# Patient Record
Sex: Male | Born: 1985 | Race: Black or African American | Hispanic: No | Marital: Married | Smoking: Never smoker
Health system: Southern US, Community
[De-identification: ages and names within clinical notes are randomized; demographics above are authoritative.]

## PROBLEM LIST (undated history)

## (undated) HISTORY — PX: HERNIA REPAIR: SHX51

---

## 2015-03-07 ENCOUNTER — Emergency Department (HOSPITAL_COMMUNITY)
Admission: EM | Admit: 2015-03-07 | Discharge: 2015-03-07 | Disposition: A | Discharge status: Home / Self Care | Payer: Self-pay | Attending: Emergency Medicine | Admitting: Emergency Medicine

## 2015-03-07 ENCOUNTER — Encounter (HOSPITAL_COMMUNITY): Payer: Self-pay | Admitting: Emergency Medicine

## 2015-03-07 DIAGNOSIS — L0501 Pilonidal cyst with abscess: Secondary | ICD-10-CM | POA: Insufficient documentation

## 2015-03-07 MED ORDER — CEPHALEXIN 500 MG PO CAPS: 500.0000 mg | ORAL_CAPSULE | Freq: Four times a day (QID) | ORAL | Status: DC

## 2015-03-07 MED ORDER — OXYCODONE-ACETAMINOPHEN 5-325 MG PO TABS
2.0000 | ORAL_TABLET | Freq: Once | ORAL | Status: AC
  Administered 2015-03-07: 2 via ORAL
  Filled 2015-03-07: qty 2

## 2015-03-07 MED ORDER — SULFAMETHOXAZOLE-TRIMETHOPRIM 800-160 MG PO TABS: 1.0000 | ORAL_TABLET | Freq: Two times a day (BID) | ORAL | Status: AC

## 2015-03-07 MED ORDER — OXYCODONE-ACETAMINOPHEN 5-325 MG PO TABS: 2.0000 | ORAL_TABLET | ORAL | Status: DC | PRN

## 2015-03-07 MED ORDER — LIDOCAINE-EPINEPHRINE (PF) 2 %-1:200000 IJ SOLN
10.0000 mL | Freq: Once | INTRAMUSCULAR | Status: AC
  Administered 2015-03-07: 10 mL via INTRADERMAL

## 2015-03-07 MED ORDER — LIDOCAINE-EPINEPHRINE (PF) 2 %-1:200000 IJ SOLN
INTRAMUSCULAR | Status: AC
  Administered 2015-03-07: 10 mL via INTRADERMAL
  Filled 2015-03-07: qty 20

## 2015-03-07 NOTE — ED Notes (Signed)
Patient states that he has had tailbone pain since Wednesday. Patient states he sits down at his job most of the time. However, patient states he has a raised, reddened, hard area around his tailbone area.

## 2015-03-07 NOTE — Discharge Instructions (Signed)
Mr. Grant Pugh,  Nice meeting you! Please keep your wound clean and dry. I am giving you antibiotics. Please take them as prescribed. Return to the emergency room if you develop fevers, increasing pain, loss of bladder/bowel control, or numbness/tingling. I hope you feel better soon!  S. Lane HackerNicole Tekeisha Hakim, PA-C

## 2015-03-07 NOTE — ED Notes (Signed)
Patient was alert, oriented and stable upon discharge. RN went over AVS and patient had no further questions.  

## 2015-03-07 NOTE — ED Provider Notes (Signed)
CSN: [id number]     Arrival date & time 03/07/15  1420 History  By signing my name below, I, Murriel HopperAlec Bankhead, attest that this documentation has been prepared under the direction and in the presence of Gwyneth SproutWhitney Plunkett, MD. Electronically Signed: Murriel HopperAlec Bankhead, ED Scribe. 03/07/2015. 2:54 PM.  Chief Complaint  Patient presents with  . Tailbone Pain   The history is provided by the patient. No language interpreter was used.    HPI Comments: Grant Pugh is a 29 y.o. male who presents to the Emergency Department complaining of constant, worsening 8/10 tailbone area pain that worsens with sitting, walking, and laying down that is red, hard, and tender to the touch that has been present for about a week. Pt states that he has been taking Aleve consistently with moderate relief. Pt states that the area has been growing in size since it began. Pt states he has never had symptoms like this before. Pt denies any injury to the area, fevers, abdominal pain, chest pain, loss of bowel or bladder function, and numbness.   History reviewed. No pertinent past medical history. Past Surgical History  Procedure Laterality Date  . Hernia repair     No family history on file. Social History  Substance Use Topics  . Smoking status: Never Smoker   . Smokeless tobacco: None  . Alcohol Use: Yes     Comment: occ    Review of Systems  Constitutional: Negative for fever.  Cardiovascular: Negative for chest pain.  Gastrointestinal: Negative for abdominal pain.  Musculoskeletal: Positive for arthralgias.  Skin: Positive for color change.  Neurological: Negative for numbness.  All other systems reviewed and are negative.     Allergies  Review of patient's allergies indicates not on file.  Home Medications   Prior to Admission medications   Medication Sig Start Date End Date Taking? Authorizing Provider  cephALEXin (KEFLEX) 500 MG capsule Take 1 capsule (500 mg total) by mouth 4 (four) times daily.  03/07/15   Melton KrebsSamantha Nicole Shavy Beachem, PA-C  oxyCODONE-acetaminophen (PERCOCET/ROXICET) 5-325 MG tablet Take 2 tablets by mouth every 4 (four) hours as needed for severe pain. 03/07/15   Melton KrebsSamantha Nicole Jeni Duling, PA-C  sulfamethoxazole-trimethoprim (BACTRIM DS,SEPTRA DS) 800-160 MG tablet Take 1 tablet by mouth 2 (two) times daily. 03/07/15 03/14/15  Melton KrebsSamantha Nicole Vickey Ewbank, PA-C   BP 145/70 mmHg  Pulse 80  Temp(Src) 97.9 F (36.6 C) (Oral)  Resp 16  Ht 5\' 9"  (1.753 m)  Wt 220 lb (99.791 kg)  BMI 32.47 kg/m2  SpO2 95% Physical Exam  Constitutional: He is oriented to person, place, and time. He appears well-developed and well-nourished. No distress.  HENT:  Head: Normocephalic and atraumatic.  Mouth/Throat: Oropharynx is clear and moist. No oropharyngeal exudate.  Eyes: Conjunctivae are normal. Pupils are equal, round, and reactive to light. Right eye exhibits no discharge. Left eye exhibits no discharge. No scleral icterus.  Neck: Normal range of motion. No tracheal deviation present.  Cardiovascular: Normal rate, regular rhythm, normal heart sounds and intact distal pulses.  Exam reveals no gallop and no friction rub.   No murmur heard. Pulmonary/Chest: Effort normal and breath sounds normal. No respiratory distress. He has no wheezes. He has no rales. He exhibits no tenderness.  Abdominal: Soft. Bowel sounds are normal. He exhibits no distension and no mass. There is no tenderness. There is no rebound and no guarding.  Musculoskeletal: Normal range of motion. He exhibits no edema.  Lymphadenopathy:    He has no cervical adenopathy.  Neurological: He is alert and oriented to person, place, and time. Coordination normal.  Skin: Skin is warm and dry. No rash noted. He is not diaphoretic.  4 cm cellulitic lesion present at left gluteal cleft  Psychiatric: He has a normal mood and affect. His behavior is normal.  Nursing note and vitals reviewed.   ED Course  Procedures   INCISION AND  DRAINAGE Performed by: Anselmo Rod Consent: Verbal consent obtained. Risks and benefits: risks, benefits and alternatives were discussed Type: abscess  Body area: Left buttock/gluteal cleft  Anesthesia: local infiltration  Incision was made with a scalpel.  Local anesthetic: lidocaine 2% with epinephrine  Anesthetic total: 4 ml  Complexity: complex Blunt dissection to break up loculations  Drainage: purulent  Drainage amount: minimal  Packing material: 1/4 in iodoform gauze  Patient tolerance: Patient tolerated the procedure well with no immediate complications.  DIAGNOSTIC STUDIES: Oxygen Saturation is 95% on room air, normal by my interpretation.    COORDINATION OF CARE: 2:52 PM Discussed treatment plan with pt at bedside and pt agreed to plan.   MDM   Final diagnoses:  Pilonidal abscess   Pilondial abscess. Will perform incision and drainage. Patient tolerated procedure well. Due to minimal amount of drainage, will prescribe abx.  Patient may be safely discharged home. Discussed reasons for return. Patient to follow-up with primary care provider within one week. Patient in understanding and agreement with the plan.  I personally performed the services described in this documentation, which was scribed in my presence. The recorded information has been reviewed and is accurate.   Melton Krebs, PA-C 03/11/15 1329  Gwyneth Sprout, MD 03/15/15 [phone]

## 2015-07-26 ENCOUNTER — Encounter (HOSPITAL_COMMUNITY): Payer: Self-pay | Admitting: Emergency Medicine

## 2015-07-26 ENCOUNTER — Emergency Department (HOSPITAL_COMMUNITY)
Admission: EM | Admit: 2015-07-26 | Discharge: 2015-07-26 | Disposition: A | Discharge status: Home / Self Care | Payer: Self-pay | Attending: Emergency Medicine | Admitting: Emergency Medicine

## 2015-07-26 DIAGNOSIS — S161XXA Strain of muscle, fascia and tendon at neck level, initial encounter: Secondary | ICD-10-CM | POA: Insufficient documentation

## 2015-07-26 DIAGNOSIS — Y9389 Activity, other specified: Secondary | ICD-10-CM | POA: Insufficient documentation

## 2015-07-26 DIAGNOSIS — Y998 Other external cause status: Secondary | ICD-10-CM | POA: Insufficient documentation

## 2015-07-26 DIAGNOSIS — Y9289 Other specified places as the place of occurrence of the external cause: Secondary | ICD-10-CM | POA: Insufficient documentation

## 2015-07-26 DIAGNOSIS — Z79899 Other long term (current) drug therapy: Secondary | ICD-10-CM | POA: Insufficient documentation

## 2015-07-26 DIAGNOSIS — X58XXXA Exposure to other specified factors, initial encounter: Secondary | ICD-10-CM | POA: Insufficient documentation

## 2015-07-26 MED ORDER — CYCLOBENZAPRINE HCL 10 MG PO TABS
10.0000 mg | ORAL_TABLET | Freq: Once | ORAL | Status: AC
  Administered 2015-07-26: 10 mg via ORAL
  Filled 2015-07-26: qty 1

## 2015-07-26 MED ORDER — CYCLOBENZAPRINE HCL 10 MG PO TABS: 10.0000 mg | ORAL_TABLET | Freq: Two times a day (BID) | ORAL | Status: DC | PRN

## 2015-07-26 MED ORDER — NAPROXEN 500 MG PO TABS
500.0000 mg | ORAL_TABLET | Freq: Once | ORAL | Status: AC
  Administered 2015-07-26: 500 mg via ORAL
  Filled 2015-07-26: qty 1

## 2015-07-26 MED ORDER — NAPROXEN 500 MG PO TABS: 500.0000 mg | ORAL_TABLET | Freq: Two times a day (BID) | ORAL | Status: DC

## 2015-07-26 NOTE — Discharge Instructions (Signed)
Cervical Sprain A cervical sprain is an injury in the neck in which the strong, fibrous tissues (ligaments) that connect your neck bones stretch or tear. Cervical sprains can range from mild to severe. Severe cervical sprains can cause the neck vertebrae to be unstable. This can lead to damage of the spinal cord and can result in serious nervous system problems. The amount of time it takes for a cervical sprain to get better depends on the cause and extent of the injury. Most cervical sprains heal in 1 to 3 weeks. CAUSES  Severe cervical sprains may be caused by:   Contact sport injuries (such as from football, rugby, wrestling, hockey, auto racing, gymnastics, diving, martial arts, or boxing).   Motor vehicle collisions.   Whiplash injuries. This is an injury from a sudden forward and backward whipping movement of the head and neck.  Falls.  Mild cervical sprains may be caused by:   Being in an awkward position, such as while cradling a telephone between your ear and shoulder.   Sitting in a chair that does not offer proper support.   Working at a poorly Landscape architect station.   Looking up or down for long periods of time.  SYMPTOMS   Pain, soreness, stiffness, or a burning sensation in the front, back, or sides of the neck. This discomfort may develop immediately after the injury or slowly, 24 hours or more after the injury.   Pain or tenderness directly in the middle of the back of the neck.   Shoulder or upper back pain.   Limited ability to move the neck.   Headache.   Dizziness.   Weakness, numbness, or tingling in the hands or arms.   Muscle spasms.   Difficulty swallowing or chewing.   Tenderness and swelling of the neck.  DIAGNOSIS  Most of the time your health care provider can diagnose a cervical sprain by taking your history and doing a physical exam. Your health care provider will ask about previous neck injuries and any known neck  problems, such as arthritis in the neck. X-rays may be taken to find out if there are any other problems, such as with the bones of the neck. Other tests, such as a CT scan or MRI, may also be needed.  TREATMENT  Treatment depends on the severity of the cervical sprain. Mild sprains can be treated with rest, keeping the neck in place (immobilization), and pain medicines. Severe cervical sprains are immediately immobilized. Further treatment is done to help with pain, muscle spasms, and other symptoms and may include:  Medicines, such as pain relievers, numbing medicines, or muscle relaxants.   Physical therapy. This may involve stretching exercises, strengthening exercises, and posture training. Exercises and improved posture can help stabilize the neck, strengthen muscles, and help stop symptoms from returning.  HOME CARE INSTRUCTIONS   Put ice on the injured area.   Put ice in a plastic bag.   Place a towel between your skin and the bag.   Leave the ice on for 15-20 minutes, 3-4 times a day.   If your injury was severe, you may have been given a cervical collar to wear. A cervical collar is a two-piece collar designed to keep your neck from moving while it heals.  Do not remove the collar unless instructed by your health care provider.  If you have long hair, keep it outside of the collar.  Ask your health care provider before making any adjustments to your collar. Minor  adjustments may be required over time to improve comfort and reduce pressure on your chin or on the back of your head.  Ifyou are allowed to remove the collar for cleaning or bathing, follow your health care provider's instructions on how to do so safely.  Keep your collar clean by wiping it with mild soap and water and drying it completely. If the collar you have been given includes removable pads, remove them every 1-2 days and hand wash them with soap and water. Allow them to air dry. They should be completely  dry before you wear them in the collar.  If you are allowed to remove the collar for cleaning and bathing, wash and dry the skin of your neck. Check your skin for irritation or sores. If you see any, tell your health care provider.  Do not drive while wearing the collar.   Only take over-the-counter or prescription medicines for pain, discomfort, or fever as directed by your health care provider.   Keep all follow-up appointments as directed by your health care provider.   Keep all physical therapy appointments as directed by your health care provider.   Make any needed adjustments to your workstation to promote good posture.   Avoid positions and activities that make your symptoms worse.   Warm up and stretch before being active to help prevent problems.  SEEK MEDICAL CARE IF:   Your pain is not controlled with medicine.   You are unable to decrease your pain medicine over time as planned.   Your activity level is not improving as expected.  SEEK IMMEDIATE MEDICAL CARE IF:   You develop any bleeding.  You develop stomach upset.  You have signs of an allergic reaction to your medicine.   Your symptoms get worse.   You develop new, unexplained symptoms.   You have numbness, tingling, weakness, or paralysis in any part of your body.  MAKE SURE YOU:   Understand these instructions.  Will watch your condition.  Will get help right away if you are not doing well or get worse.   This information is not intended to replace advice given to you by your health care provider. Make sure you discuss any questions you have with your health care provider.   Document Released: 02/10/2007 Document Revised: 04/20/2013 Document Reviewed: 10/21/2012 Elsevier Interactive Patient Education 2016 Elsevier Inc. [employer] Therapy Heat therapy can help ease sore, stiff, injured, and tight muscles and joints. Heat relaxes your muscles, which may help ease your pain.  RISKS AND  COMPLICATIONS If you have any of the following conditions, do not use heat therapy unless your health care provider has approved:  Poor circulation.  Healing wounds or scarred skin in the area being treated.  Diabetes, heart disease, or high blood pressure.  Not being able to feel (numbness) the area being treated.  Unusual swelling of the area being treated.  Active infections.  Blood clots.  Cancer.  Inability to communicate pain. This may include young children and people who have problems with their brain function (dementia).  Pregnancy. Heat therapy should only be used on old, pre-existing, or long-lasting (chronic) injuries. Do not use heat therapy on new injuries unless directed by your health care provider. HOW TO USE HEAT THERAPY There are several different kinds of heat therapy, including:  Moist heat pack.  Warm water bath.  Hot water bottle.  Electric heating pad.  Heated gel pack.  Heated wrap.  Electric heating pad. Use the heat therapy method  suggested by your health care provider. Follow your health care provider's instructions on when and how to use heat therapy. GENERAL HEAT THERAPY RECOMMENDATIONS  Do not sleep while using heat therapy. Only use heat therapy while you are awake.  Your skin may turn pink while using heat therapy. Do not use heat therapy if your skin turns red.  Do not use heat therapy if you have new pain.  High heat or long exposure to heat can cause burns. Be careful when using heat therapy to avoid burning your skin.  Do not use heat therapy on areas of your skin that are already irritated, such as with a rash or sunburn. SEEK MEDICAL CARE IF:  You have blisters, redness, swelling, or numbness.  You have new pain.  Your pain is worse. MAKE SURE YOU:  Understand these instructions.  Will watch your condition.  Will get help right away if you are not doing well or get worse.   This information is not intended to  replace advice given to you by your health care provider. Make sure you discuss any questions you have with your health care provider.   Document Released: 07/08/2011 Document Revised: 05/06/2014 Document Reviewed: 06/08/2013 Elsevier Interactive Patient Education [employer] Elsevier Inc[employer]

## 2015-07-26 NOTE — ED Provider Notes (Signed)
CSN: [id number]     Arrival date & time 07/26/15  1842 History  By signing my name below, I, Gonzella LexKimberly Bianca Gray, attest that this documentation has been prepared under the direction and in the presence of Elpidio AnisShari Lamyiah Crawshaw, PA-C. Electronically Signed: Gonzella LexKimberly Bianca Gray, Scribe. 07/26/2015. 8:48 PM.   Chief Complaint  Patient presents with  . Neck Pain   The history is provided by the patient. No language interpreter was used.   HPI Comments: Grant Pugh is a 30 y.o. male who presents to the Emergency Department complaining of sudden onset, gradually worsening neck pain, worse with turning right, which began three days ago. He also reports mild neck stiffness and states that he mainly experiences the neck pain in his posterior, right neck. He also notes that when he looks downward, it feels as though something is pulling in his posterior neck.  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 He denies chest wall pain, radiation of pain into his upper extremities and any known, recent injuries. No numbness or weakness of extremities. Pt has NKDA and is otherwise healthy.  History reviewed. No pertinent past medical history. Past Surgical History  Procedure Laterality Date  . Hernia repair     History reviewed. No pertinent family history. Social History  Substance Use Topics  . Smoking status: Never Smoker   . Smokeless tobacco: None  . Alcohol Use: Yes     Comment: occ    Review of Systems  Cardiovascular: Negative for chest pain.  Musculoskeletal: Positive for neck pain.  Skin: Negative for color change and wound.  Neurological: Negative for weakness and numbness.   Allergies  Review of patient's allergies indicates no known allergies.  Home Medications   Prior to Admission medications   Medication Sig Start Date End Date Taking? Authorizing Provider  cephALEXin (KEFLEX) 500 MG capsule Take 1 capsule (500 mg total) by mouth 4  (four) times daily. 03/07/15   Melton KrebsSamantha Nicole Riley, PA-C  oxyCODONE-acetaminophen (PERCOCET/ROXICET) 5-325 MG tablet Take 2 tablets by mouth every 4 (four) hours as needed for severe pain. 03/07/15   Melton KrebsSamantha Nicole Riley, PA-C   BP 134/75 mmHg  Pulse 59  Temp(Src) 97.7 F (36.5 C) (Oral)  Resp 18  Ht 5\' 9"  (1.753 m)  Wt 228 lb 8 oz (103.647 kg)  BMI 33.73 kg/m2  SpO2 99% Physical Exam  Constitutional: He is oriented to person, place, and time. He appears well-developed and well-nourished. No distress.  HENT:  Head: Normocephalic and atraumatic.  Eyes: Conjunctivae are normal.  Cardiovascular: Normal rate and intact distal pulses.   Pulmonary/Chest: Effort normal.  Abdominal: He exhibits no distension.  Musculoskeletal:  No midline cervical tenderness. There is palpable right paracervical tenderness that extends into the proximal trapezius area. No palpable spasm. FROM neck and upper extremities. Equal grip strength. No swelling.  Neurological: He is alert and oriented to person, place, and time.  Upper extremities are NVI.  Skin: Skin is warm and dry.  Psychiatric: He has a normal mood and affect.  Nursing note and vitals reviewed.  ED Course  Procedures  DIAGNOSTIC STUDIES:    Oxygen Saturation is 99% on RA, normal by my interpretation.   COORDINATION OF CARE:  8:48 PM Will administer and prescribe flexeril and naproxen. Discussed treatment plan with pt at bedside and pt agreed to plan.   MDM   Final diagnoses:  None    1. Cervical strain  Uncomplicated muscular  pain without neurologic abnormality requiring supportive care.   I personally performed the services described in this documentation, which was scribed in my presence. The recorded information has been reviewed and is accurate.     Elpidio Anis, PA-C 07/26/15 2346  Melene Plan, DO 07/27/15 1102

## 2015-07-26 NOTE — ED Notes (Signed)
Pt states that he has had neck pain that hurts more when he turns to the right x 3 days. Alert and oriented. Denies other s/s.

## 2015-07-26 NOTE — ED Notes (Signed)
PT DISCHARGED. INSTRUCTIONS AND PRESCRIPTIONS GIVEN. AAOX3. PT IN NO APPARENT DISTRESS. THE OPPORTUNITY TO ASK QUESTIONS WAS PROVIDED. 

## 2015-12-19 ENCOUNTER — Emergency Department (HOSPITAL_COMMUNITY): Payer: 59

## 2015-12-19 ENCOUNTER — Encounter (HOSPITAL_COMMUNITY): Payer: Self-pay | Admitting: Emergency Medicine

## 2015-12-19 ENCOUNTER — Emergency Department (HOSPITAL_COMMUNITY)
Admission: EM | Admit: 2015-12-19 | Discharge: 2015-12-19 | Disposition: A | Discharge status: Home / Self Care | Payer: 59 | Attending: Emergency Medicine | Admitting: Emergency Medicine

## 2015-12-19 DIAGNOSIS — Y929 Unspecified place or not applicable: Secondary | ICD-10-CM | POA: Insufficient documentation

## 2015-12-19 DIAGNOSIS — W208XXA Other cause of strike by thrown, projected or falling object, initial encounter: Secondary | ICD-10-CM | POA: Insufficient documentation

## 2015-12-19 DIAGNOSIS — Y990 Civilian activity done for income or pay: Secondary | ICD-10-CM | POA: Insufficient documentation

## 2015-12-19 DIAGNOSIS — Y939 Activity, unspecified: Secondary | ICD-10-CM | POA: Diagnosis not present

## 2015-12-19 DIAGNOSIS — M25561 Pain in right knee: Secondary | ICD-10-CM | POA: Diagnosis present

## 2015-12-19 MED ORDER — NAPROXEN 500 MG PO TABS: 500.0000 mg | ORAL_TABLET | Freq: Two times a day (BID) | ORAL | 0 refills | Status: DC

## 2015-12-19 NOTE — Discharge Instructions (Signed)
Take the prescribed medication as directed. May wish to ice and elevate knee at home to help with pain and/or swelling. Follow-up with Dr. Lajoyce Cornersuda if no improvement over the next week, call his office to make appointment. Return to the ED for new or worsening symptoms.

## 2015-12-19 NOTE — ED Notes (Signed)
Pt departed in NAD.  

## 2015-12-19 NOTE — ED Notes (Signed)
Patient transported to X-ray 

## 2015-12-19 NOTE — ED Provider Notes (Signed)
[facility]-EMERGENCY DEPT Provider Note   CSN: [id number] Arrival date & time: 12/19/15  1829  By signing my name below, I, Grant Pugh, attest that this documentation has been prepared under the direction and in the presence of Grant SitesLisa Rubens Cranston, PA-C. Electronically signed by: Grant Pugh, ED Scribe. 12/19/15. 8:20 PM.    History   Chief Complaint Chief Complaint  Patient presents with  . Knee Pain   The history is provided by the patient. No language interpreter was used.   HPI Comments:  Grant Pugh is a 30 y.o. male who presents to the Emergency Department complaining of moderate right knee pain, which started 4 days ago. Pt reports that a box of tiles fell off of a shelf at work and hit the side of his knee. He believes the knee dislocated and he "popped it back into place." Pain is constant and surrounds the knee cap, worse on the lateral aspect. Associated symptoms include difficulty walking and tightness with bending knee. He reports that knee occasionally makes a popping sound while walking. No modifying factors or denials reported. No previous injury or surgery to the right knee.   History reviewed. No pertinent past medical history.  There are no active problems to display for this patient.   Past Surgical History:  Procedure Laterality Date  . HERNIA REPAIR        Home Medications    Prior to Admission medications   Medication Sig Start Date End Date Taking? Authorizing Provider  cephALEXin (KEFLEX) 500 MG capsule Take 1 capsule (500 mg total) by mouth 4 (four) times daily. 03/07/15   Melton KrebsSamantha Nicole Riley, PA-C  cyclobenzaprine (FLEXERIL) 10 MG tablet Take 1 tablet (10 mg total) by mouth 2 (two) times daily as needed for muscle spasms. 07/26/15   Elpidio AnisShari Upstill, PA-C  naproxen (NAPROSYN) 500 MG tablet Take 1 tablet (500 mg total) by mouth 2 (two) times daily. 07/26/15   Elpidio AnisShari Upstill, PA-C  oxyCODONE-acetaminophen (PERCOCET/ROXICET) 5-325 MG tablet Take 2 tablets by mouth  every 4 (four) hours as needed for severe pain. 03/07/15   Melton KrebsSamantha Nicole Riley, PA-C    Family History History reviewed. No pertinent family history.  Social History Social History  Substance Use Topics  . Smoking status: Never Smoker  . Smokeless tobacco: Not on file  . Alcohol use Yes     Comment: occ     Allergies   Review of patient's allergies indicates no known allergies.   Review of Systems Review of Systems  Musculoskeletal: Positive for arthralgias and myalgias.       Right knee pain.  All other systems reviewed and are negative.    Physical Exam Updated Vital Signs BP 126/75 (BP Location: Right Arm)   Pulse 68   Temp 97.8 F (36.6 C) (Oral)   Resp 18   SpO2 97%   Physical Exam  Constitutional: He is oriented to person, place, and time. He appears well-developed and well-nourished.  HENT:  Head: Normocephalic and atraumatic.  Mouth/Throat: Oropharynx is clear and moist.  Eyes: Conjunctivae and EOM are normal. Pupils are equal, round, and reactive to light.  Neck: Normal range of motion.  Cardiovascular: Normal rate, regular rhythm and normal heart sounds.   Pulmonary/Chest: Effort normal and breath sounds normal.  Abdominal: Soft. Bowel sounds are normal.  Musculoskeletal: Normal range of motion.  Right knee overall normal in appearance, some tenderness along the lateral joint line, no swelling or bony deformity, full flexion and extension maintained without crepitus, patella tracking  normally, no ligamentous laxity noted, normal strength and sensation of right leg, normal gait  Neurological: He is alert and oriented to person, place, and time.  Skin: Skin is warm and dry.  Psychiatric: He has a normal mood and affect.  Nursing note and vitals reviewed.    ED Treatments / Results  DIAGNOSTIC STUDIES:  Oxygen Saturation is 97% on room air, normal by my interpretation.    COORDINATION OF CARE:  8:14 PM Discussed treatment plan with pt at bedside,  which includes wearing a knee sleeve and anti-inflammatory medication, and pt agreed to plan. Will refer to ortho if sxs do not improve within a week.  Labs (all labs ordered are listed, but only abnormal results are displayed) Labs Reviewed - No data to display  EKG  EKG Interpretation None       Radiology Dg Knee Complete 4 Views Right  Result Date: 12/19/2015 CLINICAL DATA:  Right knee pain status post blunt trauma. EXAM: RIGHT KNEE - COMPLETE 4+ VIEW COMPARISON:  None. FINDINGS: No evidence of fracture, dislocation, or joint effusion. No evidence of arthropathy or other focal bone abnormality. Soft tissues are unremarkable. IMPRESSION: Negative. Electronically Signed   By: Ted Mcalpineobrinka  Dimitrova M.D.   On: 12/19/2015 20:05    Procedures Procedures (including critical care time)  Medications Ordered in ED Medications - No data to display   Initial Impression / Assessment and Plan / ED Course  I have reviewed the triage vital signs and the nursing notes.  Pertinent labs & imaging results that were available during my care of the patient were reviewed by me and considered in my medical decision making (see chart for details).  Clinical Course   30 year old male here with bilateral knee pain after being hit by a box of bathroom tiles 4 days ago. He questions dislocation and spontaneous reduction. No other injuries noted. No bony deformities or swelling noted on exam. Full flexion and extension maintained without crepitus, no ligamentous laxity. Ambulatory  with steady gait. X-ray negative for acute bony findings. Knee sleeve applied for comfort and support. Discussed supportive care measures including ice and elevation as well as anti-inflammatories at home. He was given orthopedic follow-up if no improvement next week.  Discussed plan with patient, he/she acknowledged understanding and agreed with plan of care.  Return precautions given for new or worsening symptoms.  Final Clinical  Impressions(s) / ED Diagnoses   Final diagnoses:  Right knee pain    New Prescriptions Discharge Medication List as of 12/19/2015  8:35 PM     I personally performed the services described in this documentation, which was scribed in my presence. The recorded information has been reviewed and is accurate.    Garlon HatchetLisa M Kolt Mcwhirter, PA-C 12/19/15 [id number]    Derwood KaplanAnkit Nanavati, MD 12/20/15 1515

## 2015-12-19 NOTE — ED Triage Notes (Signed)
Pt sts right knee pain after tile fell on his knee

## 2018-11-23 ENCOUNTER — Other Ambulatory Visit: Payer: Self-pay

## 2018-11-23 ENCOUNTER — Emergency Department (HOSPITAL_COMMUNITY)
Admission: EM | Admit: 2018-11-23 | Discharge: 2018-11-24 | Disposition: A | Discharge status: Home / Self Care | Payer: 59 | Attending: Emergency Medicine | Admitting: Emergency Medicine

## 2018-11-23 DIAGNOSIS — L089 Local infection of the skin and subcutaneous tissue, unspecified: Secondary | ICD-10-CM | POA: Insufficient documentation

## 2018-11-23 DIAGNOSIS — Z5321 Procedure and treatment not carried out due to patient leaving prior to being seen by health care provider: Secondary | ICD-10-CM | POA: Insufficient documentation

## 2018-11-23 NOTE — ED Triage Notes (Signed)
Patient states he has a boil to the right side of his neck. Large red swollen bump noted. Hx of ingrown hair.

## 2018-11-24 ENCOUNTER — Other Ambulatory Visit: Payer: Self-pay

## 2018-11-24 ENCOUNTER — Encounter (HOSPITAL_COMMUNITY): Payer: Self-pay | Admitting: Emergency Medicine

## 2018-11-24 ENCOUNTER — Emergency Department (HOSPITAL_COMMUNITY)
Admission: EM | Admit: 2018-11-24 | Discharge: 2018-11-24 | Disposition: A | Discharge status: Home / Self Care | Payer: Self-pay | Attending: Emergency Medicine | Admitting: Emergency Medicine

## 2018-11-24 DIAGNOSIS — L0291 Cutaneous abscess, unspecified: Secondary | ICD-10-CM

## 2018-11-24 DIAGNOSIS — L0211 Cutaneous abscess of neck: Secondary | ICD-10-CM | POA: Insufficient documentation

## 2018-11-24 MED ORDER — DOXYCYCLINE HYCLATE 100 MG PO CAPS: 100.0000 mg | ORAL_CAPSULE | Freq: Two times a day (BID) | ORAL | 0 refills | Status: AC

## 2018-11-24 NOTE — Discharge Instructions (Addendum)
You have been seen today for neck abscess. Please read and follow all provided instructions. Return to the emergency room for worsening condition or new concerning symptoms including fever, surrounding redness or red streaking.  1. Medications:  Prescription sent to your pharmacy for doxycycline.  This is an antibiotic.  Please take as prescribed.  Continue usual home medications  Take medications as prescribed. Please review all of the medicines and only take them if you do not have an allergy to them.   2. Treatment: Please apply warm compresses multiple times throughout the day to the abscess.  We want it to continue to drain.  3. Follow Up: Please follow up with your primary doctor in 2-5 days for discussion of your diagnoses and further evaluation after today's visit; Call today to arrange your follow up.  If you do not have a primary care doctor use the resource guide provided to find one;   It is also a possibility that you have an allergic reaction to any of the medicines that you have been prescribed - Everybody reacts differently to medications and while MOST people have no trouble with most medicines, you may have a reaction such as nausea, vomiting, rash, swelling, shortness of breath. If this is the case, please stop taking the medicine immediately and contact your physician.  ?

## 2018-11-24 NOTE — ED Notes (Signed)
Patient stopped by desk stated he was leaving due to long wait.

## 2018-11-24 NOTE — ED Provider Notes (Signed)
[facility] EMERGENCY DEPARTMENT Provider Note   CSN: [id number] Arrival date & time: 11/24/18  6378    History   Chief Complaint Chief Complaint  Patient presents with  . Recurrent Skin Infections    HPI Grant Pugh is a 33 y.o. male presents emergency department today with chief complaint of abscess x4 days.  Abscess is located on right side of anterior neck.  He states it is tender to palpation.  He has had ingrown hairs in the past from shaving and states he thought this was similar.  However this abscess is bigger than his typical ingrown hair.  It has been spontaneously draining yellow pus.  He has not tried warm compresses or take any medications for his symptoms.  He denies fever, chills, sore throat, difficulty swallowing, voice change, neck stiffness.  History reviewed. No pertinent past medical history.  There are no active problems to display for this patient.   Past Surgical History:  Procedure Laterality Date  . HERNIA REPAIR          Home Medications    Prior to Admission medications   Medication Sig Start Date End Date Taking? Authorizing Provider  cephALEXin (KEFLEX) 500 MG capsule Take 1 capsule (500 mg total) by mouth 4 (four) times daily. 03/07/15   [place] Lions, PA-C  cyclobenzaprine (FLEXERIL) 10 MG tablet Take 1 tablet (10 mg total) by mouth 2 (two) times daily as needed for muscle spasms. 07/26/15   Charlann Lange, PA-C  doxycycline (VIBRAMYCIN) 100 MG capsule Take 1 capsule (100 mg total) by mouth 2 (two) times daily for 7 days. 11/24/18 12/01/18  Albrizze, Kaitlyn E, PA-C  naproxen (NAPROSYN) 500 MG tablet Take 1 tablet (500 mg total) by mouth 2 (two) times daily with a meal. 12/19/15   Larene Pickett, PA-C  oxyCODONE-acetaminophen (PERCOCET/ROXICET) 5-325 MG tablet Take 2 tablets by mouth every 4 (four) hours as needed for severe pain. 03/07/15   [place] Lions, PA-C    Family History No family history on file.   Social History Social History   Tobacco Use  . Smoking status: Never Smoker  Substance Use Topics  . Alcohol use: Yes    Comment: occ  . Drug use: No     Allergies   Patient has no known allergies.   Review of Systems Review of Systems  Constitutional: Negative for chills and fever.  HENT: Negative for sore throat, trouble swallowing and voice change.   Musculoskeletal: Negative for neck pain and neck stiffness.  Skin: Positive for wound.  Allergic/Immunologic: Negative for immunocompromised state.     Physical Exam Updated Vital Signs BP (!) 147/98 (BP Location: Right Arm)   Pulse 83   Temp 98 F (36.7 C) (Oral)   Resp 20   SpO2 100%   Physical Exam Vitals signs and nursing note reviewed.  Constitutional:      General: He is not in acute distress.    Appearance: He is not ill-appearing.  HENT:     Head: Normocephalic and atraumatic.     Right Ear: Tympanic membrane and external ear normal.     Left Ear: Tympanic membrane and external ear normal.     Nose: Nose normal.     Mouth/Throat:     Mouth: Mucous membranes are moist.     Pharynx: Oropharynx is clear.  Eyes:     General: No scleral icterus.       Right eye: No discharge.  Left eye: No discharge.     Extraocular Movements: Extraocular movements intact.     Conjunctiva/sclera: Conjunctivae normal.     Pupils: Pupils are equal, round, and reactive to light.  Neck:     Musculoskeletal: Normal range of motion.     Vascular: No JVD.     Comments: 4 x 3 cm area of induration with fluctuance with scab. See picture below Cardiovascular:     Rate and Rhythm: Normal rate and regular rhythm.     Pulses: Normal pulses.          Radial pulses are 2+ on the right side and 2+ on the left side.     Heart sounds: Normal heart sounds.  Pulmonary:     Comments: Lungs clear to auscultation in all fields. Symmetric chest rise. No wheezing, rales, or rhonchi. Abdominal:     Comments: Abdomen is soft,  non-distended, and non-tender in all quadrants. No rigidity, no guarding. No peritoneal signs.  Musculoskeletal: Normal range of motion.  Skin:    General: Skin is warm and dry.     Capillary Refill: Capillary refill takes less than 2 seconds.  Neurological:     Mental Status: He is oriented to person, place, and time.     GCS: GCS eye subscore is 4. GCS verbal subscore is 5. GCS motor subscore is 6.     Comments: Fluent speech, no facial droop.  Psychiatric:        Behavior: Behavior normal.        ED Treatments / Results  Labs (all labs ordered are listed, but only abnormal results are displayed) Labs Reviewed - No data to display  EKG None  Radiology No results found.  Procedures Procedures (including critical care time)  Medications Ordered in ED Medications - No data to display   Initial Impression / Assessment and Plan / ED Course  I have reviewed the triage vital signs and the nursing notes.  Pertinent labs & imaging results that were available during my care of the patient were reviewed by me and considered in my medical decision making (see chart for details).  Pt is well appearing, in no acute distress.  Airway is intact. Patient with skin abscess spontaneously draining. No signs of cellulitis is surrounding skin, no erythema, edema, warmth.  Discussed treatment options with including I&D.  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  Will discharge home with doxycycline and encourage warm compresses.  Patient is comfortable with above plan and is stable for discharge at this time. All questions were answered prior to disposition. Strict return precautions for returning to the ED were discussed. Encouraged follow up with PCP. Provided patient with a list of clinic resources to use if they do not have a PCP. Instructed to call them today to arrange follow-up in the next 24-48 hours.   Portions of this  note were generated with Scientist, clinical (histocompatibility and immunogenetics)Dragon dictation software. Dictation errors may occur despite best attempts at proofreading.     Final Clinical Impressions(s) / ED Diagnoses   Final diagnoses:  Abscess    ED Discharge Orders         Ordered    doxycycline (VIBRAMYCIN) 100 MG capsule  2 times daily     11/24/18 1132           Albrizze, Caroleen HammanKaitlyn E, PA-C 11/24/18 1140    Ashlanduratolo, Adam, DO 11/24/18 1641

## 2018-11-24 NOTE — ED Triage Notes (Signed)
Pt reports what he think is a boil or ingrown hair to R side of his neck x3-4 days. States he just wants to make sure it does not need to be drained or need meds for it. Reports it has grown in size since he first noticed it.

## 2019-09-14 ENCOUNTER — Encounter (HOSPITAL_COMMUNITY): Payer: Self-pay

## 2019-09-14 ENCOUNTER — Observation Stay (HOSPITAL_COMMUNITY)
Admission: EM | Admit: 2019-09-14 | Discharge: 2019-09-15 | Disposition: A | Discharge status: Home / Self Care | Payer: Self-pay | Attending: Surgery | Admitting: Surgery

## 2019-09-14 ENCOUNTER — Other Ambulatory Visit: Payer: Self-pay

## 2019-09-14 DIAGNOSIS — K3580 Unspecified acute appendicitis: Principal | ICD-10-CM | POA: Diagnosis present

## 2019-09-14 DIAGNOSIS — K353 Acute appendicitis with localized peritonitis, without perforation or gangrene: Secondary | ICD-10-CM

## 2019-09-14 DIAGNOSIS — Z20822 Contact with and (suspected) exposure to covid-19: Secondary | ICD-10-CM | POA: Insufficient documentation

## 2019-09-14 LAB — CBC
HCT: 42.6 % (ref 39.0–52.0)
Hemoglobin: 15.1 g/dL (ref 13.0–17.0)
MCH: 29.4 pg (ref 26.0–34.0)
MCHC: 35.4 g/dL (ref 30.0–36.0)
MCV: 82.9 fL (ref 80.0–100.0)
Platelets: 207 10*3/uL (ref 150–400)
RBC: 5.14 MIL/uL (ref 4.22–5.81)
RDW: 12.5 % (ref 11.5–15.5)
WBC: 8.2 10*3/uL (ref 4.0–10.5)
nRBC: 0 % (ref 0.0–0.2)

## 2019-09-14 LAB — URINALYSIS, ROUTINE W REFLEX MICROSCOPIC
Bilirubin Urine: NEGATIVE
Glucose, UA: NEGATIVE mg/dL
Hgb urine dipstick: NEGATIVE
Ketones, ur: NEGATIVE mg/dL
Leukocytes,Ua: NEGATIVE
Nitrite: NEGATIVE
Protein, ur: NEGATIVE mg/dL
Specific Gravity, Urine: 1.024 (ref 1.005–1.030)
pH: 6 (ref 5.0–8.0)

## 2019-09-14 LAB — COMPREHENSIVE METABOLIC PANEL
ALT: 29 U/L (ref 0–44)
AST: 27 U/L (ref 15–41)
Albumin: 4.3 g/dL (ref 3.5–5.0)
Alkaline Phosphatase: 55 U/L (ref 38–126)
Anion gap: 10 (ref 5–15)
BUN: 10 mg/dL (ref 6–20)
CO2: 27 mmol/L (ref 22–32)
Calcium: 9.3 mg/dL (ref 8.9–10.3)
Chloride: 105 mmol/L (ref 98–111)
Creatinine, Ser: 1.13 mg/dL (ref 0.61–1.24)
GFR calc Af Amer: >60 mL/min (ref >60)
GFR calc non Af Amer: >60 mL/min (ref >60)
Glucose, Bld: 101 mg/dL — ABNORMAL HIGH (ref 70–99)
Potassium: 3.8 mmol/L (ref 3.5–5.1)
Sodium: 142 mmol/L (ref 135–145)
Total Bilirubin: 0.5 mg/dL (ref 0.3–1.2)
Total Protein: 7.6 g/dL (ref 6.5–8.1)

## 2019-09-14 LAB — LIPASE, BLOOD: Lipase: 27 U/L (ref 11–51)

## 2019-09-14 MED ORDER — ONDANSETRON 4 MG PO TBDP: 4.0000 mg | ORAL_TABLET | Freq: Once | ORAL | Status: DC | PRN

## 2019-09-14 MED ORDER — SODIUM CHLORIDE 0.9% FLUSH: 3.0000 mL | Freq: Once | INTRAVENOUS | Status: DC

## 2019-09-14 NOTE — ED Triage Notes (Signed)
Pt arrives to ED w/ c/o RLQ abdominal pain 9/10 that started yesterday. Pt endorses nausea. Denies fever, vomiting.

## 2019-09-15 ENCOUNTER — Encounter (HOSPITAL_COMMUNITY): Payer: Self-pay | Admitting: General Surgery

## 2019-09-15 ENCOUNTER — Observation Stay (HOSPITAL_COMMUNITY): Payer: Self-pay | Admitting: Certified Registered"

## 2019-09-15 ENCOUNTER — Encounter (HOSPITAL_COMMUNITY)
Admission: EM | Disposition: A | Discharge status: Home / Self Care | Payer: Self-pay | Source: Home / Self Care | Attending: Emergency Medicine

## 2019-09-15 ENCOUNTER — Emergency Department (HOSPITAL_COMMUNITY): Payer: Self-pay

## 2019-09-15 DIAGNOSIS — K3580 Unspecified acute appendicitis: Secondary | ICD-10-CM | POA: Diagnosis present

## 2019-09-15 HISTORY — PX: LAPAROSCOPIC APPENDECTOMY: SHX408

## 2019-09-15 LAB — SARS CORONAVIRUS 2 BY RT PCR (HOSPITAL ORDER, PERFORMED IN ~~LOC~~ HOSPITAL LAB): SARS Coronavirus 2: NEGATIVE

## 2019-09-15 SURGERY — APPENDECTOMY, LAPAROSCOPIC
Anesthesia: General | Site: Abdomen

## 2019-09-15 MED ORDER — FENTANYL CITRATE (PF) 100 MCG/2ML IJ SOLN: 25.0000 ug | INTRAMUSCULAR | Status: DC | PRN

## 2019-09-15 MED ORDER — ONDANSETRON HCL 4 MG/2ML IJ SOLN: 4.0000 mg | Freq: Four times a day (QID) | INTRAMUSCULAR | Status: DC | PRN

## 2019-09-15 MED ORDER — OXYCODONE HCL 5 MG PO TABS: 5.0000 mg | ORAL_TABLET | Freq: Once | ORAL | Status: DC | PRN

## 2019-09-15 MED ORDER — LIDOCAINE 2% (20 MG/ML) 5 ML SYRINGE
INTRAMUSCULAR | Status: AC
  Filled 2019-09-15: qty 5

## 2019-09-15 MED ORDER — FENTANYL CITRATE (PF) 100 MCG/2ML IJ SOLN
INTRAMUSCULAR | Status: DC | PRN
  Administered 2019-09-15: 150 ug via INTRAVENOUS
  Administered 2019-09-15: 100 ug via INTRAVENOUS

## 2019-09-15 MED ORDER — ONDANSETRON HCL 4 MG/2ML IJ SOLN: 4.0000 mg | Freq: Once | INTRAMUSCULAR | Status: DC | PRN

## 2019-09-15 MED ORDER — LIDOCAINE 2% (20 MG/ML) 5 ML SYRINGE
INTRAMUSCULAR | Status: DC | PRN
  Administered 2019-09-15: 40 mg via INTRAVENOUS

## 2019-09-15 MED ORDER — ROCURONIUM BROMIDE 10 MG/ML (PF) SYRINGE
PREFILLED_SYRINGE | INTRAVENOUS | Status: DC | PRN
  Administered 2019-09-15: 60 mg via INTRAVENOUS

## 2019-09-15 MED ORDER — PROPOFOL 10 MG/ML IV BOLUS
INTRAVENOUS | Status: AC
  Filled 2019-09-15: qty 20

## 2019-09-15 MED ORDER — MIDAZOLAM HCL 2 MG/2ML IJ SOLN
INTRAMUSCULAR | Status: AC
  Filled 2019-09-15: qty 2

## 2019-09-15 MED ORDER — STERILE WATER FOR IRRIGATION IR SOLN
Status: DC | PRN
  Administered 2019-09-15: 1000 mL

## 2019-09-15 MED ORDER — OXYCODONE HCL 5 MG PO TABS: 5.0000 mg | ORAL_TABLET | ORAL | 0 refills | Status: AC | PRN

## 2019-09-15 MED ORDER — DEXAMETHASONE SODIUM PHOSPHATE 10 MG/ML IJ SOLN
INTRAMUSCULAR | Status: DC | PRN
  Administered 2019-09-15: 10 mg via INTRAVENOUS

## 2019-09-15 MED ORDER — HYDROMORPHONE HCL 1 MG/ML IJ SOLN: 0.5000 mg | INTRAMUSCULAR | Status: DC | PRN

## 2019-09-15 MED ORDER — IBUPROFEN 800 MG PO TABS
800.0000 mg | ORAL_TABLET | Freq: Three times a day (TID) | ORAL | 0 refills | Status: AC | PRN
Start: 2019-09-15 — End: ?

## 2019-09-15 MED ORDER — OXYCODONE HCL 5 MG/5ML PO SOLN: 5.0000 mg | Freq: Once | ORAL | Status: DC | PRN

## 2019-09-15 MED ORDER — DEXMEDETOMIDINE HCL IN NACL 200 MCG/50ML IV SOLN
INTRAVENOUS | Status: DC | PRN
Start: 2019-09-15 — End: 2019-09-15
  Administered 2019-09-15: 8 ug via INTRAVENOUS
  Administered 2019-09-15: 12 ug via INTRAVENOUS

## 2019-09-15 MED ORDER — PIPERACILLIN-TAZOBACTAM 3.375 G IVPB 30 MIN: 3.3750 g | Freq: Once | INTRAVENOUS | Status: DC

## 2019-09-15 MED ORDER — SODIUM CHLORIDE 0.9 % IV BOLUS
1000.0000 mL | Freq: Once | INTRAVENOUS | Status: AC
  Administered 2019-09-15: 1000 mL via INTRAVENOUS

## 2019-09-15 MED ORDER — KETOROLAC TROMETHAMINE 30 MG/ML IJ SOLN: 30.0000 mg | Freq: Four times a day (QID) | INTRAMUSCULAR | Status: DC | PRN

## 2019-09-15 MED ORDER — DEXTROSE-NACL 5-0.9 % IV SOLN: INTRAVENOUS | Status: DC

## 2019-09-15 MED ORDER — MIDAZOLAM HCL 5 MG/5ML IJ SOLN
INTRAMUSCULAR | Status: DC | PRN
  Administered 2019-09-15: 2 mg via INTRAVENOUS

## 2019-09-15 MED ORDER — ONDANSETRON HCL 4 MG/2ML IJ SOLN
INTRAMUSCULAR | Status: DC | PRN
  Administered 2019-09-15: 4 mg via INTRAVENOUS

## 2019-09-15 MED ORDER — LACTATED RINGERS IV SOLN: INTRAVENOUS | Status: DC

## 2019-09-15 MED ORDER — 0.9 % SODIUM CHLORIDE (POUR BTL) OPTIME
TOPICAL | Status: DC | PRN
  Administered 2019-09-15: 1000 mL

## 2019-09-15 MED ORDER — PROPOFOL 10 MG/ML IV BOLUS
INTRAVENOUS | Status: DC | PRN
  Administered 2019-09-15: 180 mg via INTRAVENOUS

## 2019-09-15 MED ORDER — FENTANYL CITRATE (PF) 100 MCG/2ML IJ SOLN
25.0000 ug | Freq: Once | INTRAMUSCULAR | Status: AC
  Administered 2019-09-15: 25 ug via INTRAVENOUS
  Filled 2019-09-15: qty 2

## 2019-09-15 MED ORDER — METRONIDAZOLE IN NACL 5-0.79 MG/ML-% IV SOLN
500.0000 mg | Freq: Once | INTRAVENOUS | Status: AC
  Administered 2019-09-15: 500 mg via INTRAVENOUS
  Filled 2019-09-15: qty 100

## 2019-09-15 MED ORDER — GLYCOPYRROLATE PF 0.2 MG/ML IJ SOSY
PREFILLED_SYRINGE | INTRAMUSCULAR | Status: DC | PRN
  Administered 2019-09-15: .1 mg via INTRAVENOUS

## 2019-09-15 MED ORDER — FENTANYL CITRATE (PF) 250 MCG/5ML IJ SOLN
INTRAMUSCULAR | Status: AC
  Filled 2019-09-15: qty 5

## 2019-09-15 MED ORDER — SUGAMMADEX SODIUM 200 MG/2ML IV SOLN
INTRAVENOUS | Status: DC | PRN
Start: 2019-09-15 — End: 2019-09-15
  Administered 2019-09-15: 210 mg via INTRAVENOUS

## 2019-09-15 MED ORDER — BUPIVACAINE HCL 0.25 % IJ SOLN
INTRAMUSCULAR | Status: DC | PRN
  Administered 2019-09-15: 30 mL

## 2019-09-15 MED ORDER — IOHEXOL 300 MG/ML  SOLN
100.0000 mL | Freq: Once | INTRAMUSCULAR | Status: AC | PRN
  Administered 2019-09-15: 100 mL via INTRAVENOUS

## 2019-09-15 MED ORDER — ROCURONIUM BROMIDE 10 MG/ML (PF) SYRINGE
PREFILLED_SYRINGE | INTRAVENOUS | Status: AC
  Filled 2019-09-15: qty 10

## 2019-09-15 MED ORDER — ONDANSETRON 4 MG PO TBDP: 4.0000 mg | ORAL_TABLET | Freq: Four times a day (QID) | ORAL | Status: DC | PRN

## 2019-09-15 MED ORDER — SODIUM CHLORIDE 0.9 % IV SOLN
2.0000 g | Freq: Once | INTRAVENOUS | Status: AC
  Administered 2019-09-15: 2 g via INTRAVENOUS
  Filled 2019-09-15: qty 20

## 2019-09-15 SURGICAL SUPPLY — 46 items
APPLIER CLIP ROT 10 11.4 M/L (STAPLE)
BLADE CLIPPER SURG (BLADE) IMPLANT
CANISTER SUCT 3000ML PPV (MISCELLANEOUS) IMPLANT
CHLORAPREP W/TINT 26 (MISCELLANEOUS) ×2 IMPLANT
CLIP APPLIE ROT 10 11.4 M/L (STAPLE) IMPLANT
COVER SURGICAL LIGHT HANDLE (MISCELLANEOUS) ×2 IMPLANT
COVER WAND RF STERILE (DRAPES) ×2 IMPLANT
CUTTER FLEX LINEAR 45M (STAPLE) ×2 IMPLANT
DERMABOND ADHESIVE PROPEN (GAUZE/BANDAGES/DRESSINGS) ×1
DERMABOND ADVANCED .7 DNX6 (GAUZE/BANDAGES/DRESSINGS) ×1 IMPLANT
ELECT CAUTERY BLADE 6.4 (BLADE) ×2 IMPLANT
ELECT REM PT RETURN 9FT ADLT (ELECTROSURGICAL) ×2
ELECTRODE REM PT RTRN 9FT ADLT (ELECTROSURGICAL) ×1 IMPLANT
ENDOLOOP SUT PDS II  0 18 (SUTURE)
ENDOLOOP SUT PDS II 0 18 (SUTURE) IMPLANT
GLOVE BIO SURGEON STRL SZ 6.5 (GLOVE) ×2 IMPLANT
GLOVE BIOGEL PI IND STRL 6 (GLOVE) ×1 IMPLANT
GLOVE BIOGEL PI INDICATOR 6 (GLOVE) ×1
GOWN STRL REUS W/ TWL LRG LVL3 (GOWN DISPOSABLE) ×3 IMPLANT
GOWN STRL REUS W/TWL LRG LVL3 (GOWN DISPOSABLE) ×3
KIT BASIN OR (CUSTOM PROCEDURE TRAY) ×2 IMPLANT
KIT TURNOVER KIT B (KITS) ×2 IMPLANT
NS IRRIG 1000ML POUR BTL (IV SOLUTION) ×2 IMPLANT
PAD ARMBOARD 7.5X6 YLW CONV (MISCELLANEOUS) ×4 IMPLANT
PENCIL BUTTON HOLSTER BLD 10FT (ELECTRODE) ×2 IMPLANT
POUCH RETRIEVAL ECOSAC 10 (ENDOMECHANICALS) IMPLANT
POUCH RETRIEVAL ECOSAC 10MM (ENDOMECHANICALS) ×1
POUCH SPECIMEN RETRIEVAL 10MM (ENDOMECHANICALS) ×2 IMPLANT
RELOAD 45 VASCULAR/THIN (ENDOMECHANICALS) ×2 IMPLANT
RELOAD STAPLE 45 2.5 WHT GRN (ENDOMECHANICALS) IMPLANT
RELOAD STAPLE 45 3.5 BLU ETS (ENDOMECHANICALS) IMPLANT
RELOAD STAPLE TA45 3.5 REG BLU (ENDOMECHANICALS) ×2 IMPLANT
SCISSORS LAP 5X35 DISP (ENDOMECHANICALS) IMPLANT
SET IRRIG TUBING LAPAROSCOPIC (IRRIGATION / IRRIGATOR) ×2 IMPLANT
SET TUBE SMOKE EVAC HIGH FLOW (TUBING) ×2 IMPLANT
SHEARS HARMONIC ACE PLUS 36CM (ENDOMECHANICALS) IMPLANT
SLEEVE ENDOPATH XCEL 5M (ENDOMECHANICALS) ×2 IMPLANT
SPECIMEN JAR SMALL (MISCELLANEOUS) ×2 IMPLANT
SUT MNCRL AB 4-0 PS2 18 (SUTURE) ×2 IMPLANT
SUT VICRYL 0 UR6 27IN ABS (SUTURE) IMPLANT
TOWEL GREEN STERILE (TOWEL DISPOSABLE) ×2 IMPLANT
TOWEL GREEN STERILE FF (TOWEL DISPOSABLE) ×2 IMPLANT
TRAY LAPAROSCOPIC MC (CUSTOM PROCEDURE TRAY) ×2 IMPLANT
TROCAR XCEL BLUNT TIP 100MML (ENDOMECHANICALS) ×2 IMPLANT
TROCAR XCEL NON-BLD 5MMX100MML (ENDOMECHANICALS) ×2 IMPLANT
WATER STERILE IRR 1000ML POUR (IV SOLUTION) ×2 IMPLANT

## 2019-09-15 NOTE — ED Provider Notes (Signed)
[facility] EMERGENCY DEPARTMENT Provider Note   CSN: [id number] Arrival date & time: 09/14/19  2044     History Chief Complaint  Patient presents with  . Abdominal Pain    Grant Pugh is a 34 y.o. male.  34 y.o male with a PMH of right inguinal hernia repair presents to the ED with a chief complaint of RLQ abdominal pain x yesterday. Patient describes this as a constant full pain localized to the RLQ with no radiation. Reports pain was tolerable at first but later intensified. He states the pain is a 9/10 exacerbated with movement. He reports "I did a little google, and I think there is something wrong with my appendix". Last meal was sometime last night. Reports no fever, N/V/D, no uirnary symptoms.   The history is provided by the patient.       History reviewed. No pertinent past medical history.  There are no problems to display for this patient.   Past Surgical History:  Procedure Laterality Date  . HERNIA REPAIR         No family history on file.  Social History   Tobacco Use  . Smoking status: Never Smoker  Substance Use Topics  . Alcohol use: Yes    Comment: occ  . Drug use: No    Home Medications Prior to Admission medications   Medication Sig Start Date End Date Taking? Authorizing Provider  cephALEXin (KEFLEX) 500 MG capsule Take 1 capsule (500 mg total) by mouth 4 (four) times daily. Patient not taking: Reported on 09/15/2019 03/07/15   Melton Krebs, PA-C  cyclobenzaprine (FLEXERIL) 10 MG tablet Take 1 tablet (10 mg total) by mouth 2 (two) times daily as needed for muscle spasms. Patient not taking: Reported on 09/15/2019 07/26/15   Elpidio Anis, PA-C  naproxen (NAPROSYN) 500 MG tablet Take 1 tablet (500 mg total) by mouth 2 (two) times daily with a meal. Patient not taking: Reported on 09/15/2019 12/19/15   Garlon Hatchet, PA-C  oxyCODONE-acetaminophen (PERCOCET/ROXICET) 5-325 MG tablet Take 2 tablets by mouth every 4  (four) hours as needed for severe pain. Patient not taking: Reported on 09/15/2019 03/07/15   Melton Krebs, PA-C    Allergies    Banana  Review of Systems   Review of Systems  Constitutional: Negative for fever.  HENT: Negative for sore throat.   Respiratory: Negative for shortness of breath.   Cardiovascular: Negative for chest pain.  Gastrointestinal: Positive for abdominal pain. Negative for diarrhea, nausea and vomiting.  Genitourinary: Negative for flank pain.  Musculoskeletal: Negative for back pain.  Skin: Negative for pallor and wound.  Neurological: Negative for light-headedness and headaches.  All other systems reviewed and are negative.   Physical Exam Updated Vital Signs BP (!) 141/89 (BP Location: Left Arm)   Pulse 69   Temp 98.3 F (36.8 C) (Oral)   Resp 16   SpO2 100%   Physical Exam Vitals and nursing note reviewed.  Constitutional:      Appearance: He is well-developed. He is not ill-appearing.  HENT:     Head: Normocephalic and atraumatic.  Cardiovascular:     Rate and Rhythm: Normal rate.  Pulmonary:     Effort: Pulmonary effort is normal.     Breath sounds: No wheezing or rales.  Abdominal:     General: Abdomen is flat. Bowel sounds are normal.     Palpations: Abdomen is soft.     Tenderness: There is abdominal tenderness in the  right lower quadrant. There is no right CVA tenderness or left CVA tenderness. Positive signs include McBurney's sign and psoas sign. Negative signs include Murphy's sign.     Hernia: No hernia is present.  Skin:    General: Skin is warm and dry.  Neurological:     Mental Status: He is alert and oriented to person, place, and time.     ED Results / Procedures / Treatments   Labs (all labs ordered are listed, but only abnormal results are displayed) Labs Reviewed  COMPREHENSIVE METABOLIC PANEL - Abnormal; Notable for the following components:      Result Value   Glucose, Bld 101 (*)    All other components  within normal limits  SARS CORONAVIRUS 2 BY RT PCR (HOSPITAL ORDER, [facility])  LIPASE, BLOOD  CBC  URINALYSIS, ROUTINE W REFLEX MICROSCOPIC    EKG None  Radiology CT ABDOMEN PELVIS W CONTRAST  Result Date: 09/15/2019 CLINICAL DATA:  Right lower quadrant pain with appendicitis suspected EXAM: CT ABDOMEN AND PELVIS WITH CONTRAST TECHNIQUE: Multidetector CT imaging of the abdomen and pelvis was performed using the standard protocol following bolus administration of intravenous contrast. CONTRAST:  153mL OMNIPAQUE IOHEXOL 300 MG/ML  SOLN COMPARISON:  None. FINDINGS: Lower chest:  No contributory findings. Hepatobiliary: No focal liver abnormality.No evidence of biliary obstruction or stone. Pancreas: Unremarkable. Spleen: Unremarkable. Adrenals/Urinary Tract: Negative adrenals. No hydronephrosis or stone. Unremarkable bladder. Stomach/Bowel: Thickened and hypervascular appendix with submucosal low-density edematous appearance. Outer wall diameter is 13 mm and there is mesoappendiceal stranding. No perforation or abscess. The appendix is in typical location extending inferiorly from the cecum in the right lower quadrant. No appendicolith. No ileus Vascular/Lymphatic: No acute vascular abnormality. No mass or adenopathy. Reproductive:No pathologic findings. Other: No ascites or pneumoperitoneum. Musculoskeletal: No acute abnormalities. IMPRESSION: Acute, non-perforated appendicitis. The appendix is in typical location with no appendicolith. Electronically Signed   By: Monte Fantasia M.D.   On: 09/15/2019 04:35    Procedures Procedures (including critical care time)  Medications Ordered in ED Medications  sodium chloride flush (NS) 0.9 % injection 3 mL (3 mLs Intravenous Not Given 09/15/19 0408)  ondansetron (ZOFRAN-ODT) disintegrating tablet 4 mg (has no administration in time range)  sodium chloride 0.9 % bolus 1,000 mL (0 mLs Intravenous Stopped 09/15/19 0544)    fentaNYL (SUBLIMAZE) injection 25 mcg (25 mcg Intravenous Given 09/15/19 0425)  iohexol (OMNIPAQUE) 300 MG/ML solution 100 mL (100 mLs Intravenous Contrast Given 09/15/19 0416)  cefTRIAXone (ROCEPHIN) 2 g in sodium chloride 0.9 % 100 mL IVPB (0 g Intravenous Stopped 09/15/19 0544)    And  metroNIDAZOLE (FLAGYL) IVPB 500 mg (0 mg Intravenous Stopped 09/15/19 0615)    ED Course  I have reviewed the triage vital signs and the nursing notes.  Pertinent labs & imaging results that were available during my care of the patient were reviewed by me and considered in my medical decision making (see chart for details).    MDM Rules/Calculators/A&P   Patient with no pertinent past medical history presents to the ED with complaints of right lower quadrant pain which began yesterday, reports doing some global search which told him his appendix was likely inflamed. He reports focal tenderness to palpation around the right lower quadrant, his last meal was sometimes yesterday. No nausea, vomiting, diarrhea, no fevers at home or urinary symptoms.  Does have a prior history of a right inguinal repaired at the age of 28, no palpable hernia on  my exam. He is nontoxic, non-ill-appearing, afebrile on arrival in the ED. Lungs are clear to auscultation, abdomen is soft, there is tenderness to palpation along the right lower quadrant, positive psoas on my exam. Positive McBurney's point. Differential diagnosis included but not limited to appendicitis, nephrolithiasis, recurrent inguinal hernia. We discussed imaging with CT scan to further evaluate pain. Patient is agreeable of this at this time. Writer with pain control along with fluids.  CT abdomen and pelvis showed: Acute, non-perforated appendicitis. The appendix is in typical  location with no appendicolith.   5:00 AM IV antibiotics have been ordered, COVID-19 test has been sent.  A call for general surgery was placed.  Patient was informed of his results. Last  meal at 6 pm 09/14/2019  5:08 AM spoke to Dr. Derrell Lolling who agreed to evaluate patient in the ED.  Patient has been seen by general surgery per nursing staff, no update on intervention at this time. Will likely go to OR today. Care signed out to [facility] pending surgery admission.    Portions of this note were generated with Scientist, clinical (histocompatibility and immunogenetics). Dictation errors may occur despite best attempts at proofreading.  Final Clinical Impression(s) / ED Diagnoses Final diagnoses:  Acute appendicitis with localized peritonitis, without perforation, abscess, or gangrene    Rx / DC Orders ED Discharge Orders    None       Claude Manges, PA-C 09/15/19 4098    Zadie Rhine, MD 09/15/19 [phone]

## 2019-09-15 NOTE — Anesthesia Procedure Notes (Signed)
Procedure Name: Intubation Date/Time: 09/15/2019 11:10 AM Performed by: Rosiland Oz, CRNA Pre-anesthesia Checklist: Patient identified, Emergency Drugs available, Suction available, Patient being monitored and Timeout performed Patient Re-evaluated:Patient Re-evaluated prior to induction Oxygen Delivery Method: Circle system utilized Preoxygenation: Pre-oxygenation with 100% oxygen Induction Type: IV induction Ventilation: Mask ventilation without difficulty Laryngoscope Size: Miller and 3 Grade View: Grade I Tube type: Oral Tube size: 7.5 mm Number of attempts: 1 Airway Equipment and Method: Stylet Placement Confirmation: ETT inserted through vocal cords under direct vision,  positive ETCO2 and breath sounds checked- equal and bilateral Secured at: 21 cm Tube secured with: Tape Dental Injury: Teeth and Oropharynx as per pre-operative assessment

## 2019-09-15 NOTE — Anesthesia Postprocedure Evaluation (Signed)
Anesthesia Post Note  Patient: Grant Pugh  Procedure(s) Performed: LAPAROSCOPIC APPENDECTOMY (N/A Abdomen)     Patient location during evaluation: PACU Anesthesia Type: General Level of consciousness: awake and alert Pain management: pain level controlled Vital Signs Assessment: post-procedure vital signs reviewed and stable Respiratory status: spontaneous breathing, nonlabored ventilation, respiratory function stable and patient connected to nasal cannula oxygen Cardiovascular status: blood pressure returned to baseline and stable Postop Assessment: no apparent nausea or vomiting Anesthetic complications: no    Last Vitals:  Vitals:   09/15/19 1315 09/15/19 1330  BP: 117/81 120/76  Pulse: 66 65  Resp: (!) 21 19  Temp:  36.6 C  SpO2: 94% 99%    Last Pain:  Vitals:   09/15/19 1330  TempSrc:   PainSc: 0-No pain                 Daisa Stennis COKER

## 2019-09-15 NOTE — Progress Notes (Signed)
Patient seen and examined. Informed consent was obtained after detailed explanation of risks, including bleeding, infection, abscess, staple line leak, stump appendicitis, and need for conversion to open procedure. All questions answered to the patient's satisfaction. Wife, Fontaine Hehl, to be contacted post-op at [phone]  Diamantina Monks, MD General and Trauma Surgery [facility]

## 2019-09-15 NOTE — Op Note (Signed)
   Operative Note   Date: 09/15/2019  Procedure: laparoscopic appendectomy  Pre-op diagnosis: acute appendicitis Post-op diagnosis: Grade 1b appendicitis  Indication and clinical history: The patient is a 34 y.o. year old male with acute appendicitis     Surgeon: Diamantina Monks, MD  Anesthesia: General  Findings:  . Specimen: appendix . EBL: <5cc . Drains/Implants: none  Disposition: PACU - hemodynamically stable.  Description of procedure: The patient was positioned supine on the operating room table. Time-out was performed verifying correct patient, procedure, signature of informed consent, and administration of pre-operative antibiotics. General anesthetic induction and intubation were uneventful. Foley catheter insertion was not performed as patient voided immediately prior to the procedure . The abdomen was prepped and draped in the usual sterile fashion. An infra-umbilical incision was made using an open technique using zero vicryl stay sutures on either side of the fascia and a 33mm Hassan port inserted. After establishing pneumoperitoneum, which the patient tolerated well, the abdominal cavity was inspected and no injury of any intra-abdominal structures was identified. Two additional five millimeter ports were placed under direct visualization and using local anesthetic in the suprapubic and left lower quadrant regions. The patient was repositioned to Trendelenburg with the left side down. Further inspection of the right lower quadrant revealed no abscess or purulent fluid and grade 1b appendicitis. The appendix was dissected away from its mesoappendix and an endoscopic stapler used to divide the mesoappendix using a vascular load. A bowel load of the endoscopic stapler was used to staple across the appendix at its base. Both staple lines were inspected and found to be intact and without bleeding. The appendix was placed in an endoscopic specimen retrieval bag, removed via the  umbilical port site, and sent to pathology as a specimen. The right lower quadrant was again inspected and hemostasis confirmed. The fascia of the umbilical port site was removed last after desufflating the abdomen and the fascia re-approximated using the stay sutures. Additional local anesthetic was administered at the umbilical incision site. The skin of all port sites was closed with 4-0 monocryl. Sterile dressings were applied. All sponge and instrument counts were correct at the conclusion of the procedure. The patient was awakened from anesthesia, extubated uneventfully, and transported to the PACU in good condition. There were no complications.   Clinical update provided to wife, Jamale Spangler, via phone after surgery.    Diamantina Monks, MD General and Trauma Surgery [facility]

## 2019-09-15 NOTE — Anesthesia Preprocedure Evaluation (Signed)
Anesthesia Evaluation  Patient identified by MRN, date of birth, ID band Patient awake    Reviewed: Allergy & Precautions, NPO status , Patient's Chart, lab work & pertinent test results  Airway Mallampati: II  TM Distance: >3 FB Neck ROM: Full    Dental  (+) Teeth Intact, Dental Advisory Given   Pulmonary    breath sounds clear to auscultation       Cardiovascular  Rhythm:Regular Rate:Normal     Neuro/Psych    GI/Hepatic   Endo/Other    Renal/GU      Musculoskeletal   Abdominal   Peds  Hematology   Anesthesia Other Findings   Reproductive/Obstetrics                             Anesthesia Physical Anesthesia Plan  ASA: II and emergent  Anesthesia Plan: General   Post-op Pain Management:    Induction: Intravenous, Rapid sequence and Cricoid pressure planned  PONV Risk Score and Plan: Ondansetron and Dexamethasone  Airway Management Planned: Oral ETT  Additional Equipment:   Intra-op Plan:   Post-operative Plan: Extubation in OR  Informed Consent: I have reviewed the patients History and Physical, chart, labs and discussed the procedure including the risks, benefits and alternatives for the proposed anesthesia with the patient or authorized representative who has indicated his/her understanding and acceptance.     Dental advisory given  Plan Discussed with: CRNA and Anesthesiologist  Anesthesia Plan Comments:         Anesthesia Quick Evaluation  

## 2019-09-15 NOTE — Transfer of Care (Signed)
Immediate Anesthesia Transfer of Care Note  Patient: Grant Pugh  Procedure(s) Performed: LAPAROSCOPIC APPENDECTOMY (N/A Abdomen)  Patient Location: PACU  Anesthesia Type:General  Level of Consciousness: drowsy and patient cooperative  Airway & Oxygen Therapy: Patient Spontanous Breathing  Post-op Assessment: Report given to RN and Post -op Vital signs reviewed and stable  Post vital signs: Reviewed and stable  Last Vitals:  Vitals Value Taken Time  BP 123/73 09/15/19 1229  Temp    Pulse 70 09/15/19 1232  Resp 19 09/15/19 1232  SpO2 96 % 09/15/19 1232  Vitals shown include unvalidated device data.  Last Pain:  Vitals:   09/15/19 0910  TempSrc: Oral  PainSc:          Complications: No apparent anesthesia complications

## 2019-09-15 NOTE — H&P (Signed)
Grant Pugh is an 34 y.o. male.   Chief Complaint: Abdominal pain HPI: Patient is a 33 year old male, with no past medical history, who comes in with a 1 day history of right lower quadrant abdominal pain.  Patient states that the pain was progressive and progressed to the right lateral abdominal wall.  Patient states he had no nausea vomiting, diarrhea or fevers.  Patient was brought to the ER for further evaluation for continued pain.  Upon evaluation in the ER patient underwent CT scan laboratory studies.  Patient CT scan did reveal acute appendicitis.  Patient had no leukocytosis.  I did review the CT scan personally.  Patient had no previous abdominal surgery.    History reviewed. No pertinent past medical history.  Past Surgical History:  Procedure Laterality Date  . HERNIA REPAIR      No family history on file. Social History:  reports that he has never smoked. He does not have any smokeless tobacco history on file. He reports current alcohol use. He reports that he does not use drugs.  Allergies:  Allergies  Allergen Reactions  . Banana Rash    (Not in a hospital admission)   Results for orders placed or performed during the hospital encounter of 09/14/19 (from the past 48 hour(s))  Lipase, blood     Status: None   Collection Time: 09/14/19  9:26 PM  Result Value Ref Range   Lipase 27 11 - 51 U/L    Comment: Performed at [facility], [address], [place], [place] [address]  Comprehensive metabolic panel     Status: Abnormal   Collection Time: 09/14/19  9:26 PM  Result Value Ref Range   Sodium 142 135 - 145 mmol/L   Potassium 3.8 3.5 - 5.1 mmol/L   Chloride 105 98 - 111 mmol/L   CO2 27 22 - 32 mmol/L   Glucose, Bld 101 (H) 70 - 99 mg/dL    Comment: Glucose reference range applies only to samples taken after fasting for at least 8 hours.   BUN 10 6 - 20 mg/dL   Creatinine, Ser 3.01 0.61 - 1.24 mg/dL   Calcium 9.3 8.9 - 60.1 mg/dL   Total Protein 7.6  6.5 - 8.1 g/dL   Albumin 4.3 3.5 - 5.0 g/dL   AST 27 15 - 41 U/L   ALT 29 0 - 44 U/L   Alkaline Phosphatase 55 38 - 126 U/L   Total Bilirubin 0.5 0.3 - 1.2 mg/dL   GFR calc non Af Amer >60 >60 mL/min   GFR calc Af Amer >60 >60 mL/min   Anion gap 10 5 - 15    Comment: Performed at [facility], [address], [place], [place] [address]  CBC     Status: None   Collection Time: 09/14/19  9:26 PM  Result Value Ref Range   WBC 8.2 4.0 - 10.5 K/uL   RBC 5.14 4.22 - 5.81 MIL/uL   Hemoglobin 15.1 13.0 - 17.0 g/dL   HCT 55.7 32.2 - 02.5 %   MCV 82.9 80.0 - 100.0 fL   MCH 29.4 26.0 - 34.0 pg   MCHC 35.4 30.0 - 36.0 g/dL   RDW 42.7 06.2 - 37.6 %   Platelets 207 150 - 400 K/uL   nRBC 0.0 0.0 - 0.2 %    Comment: Performed at [facility], [address] [place], [place] [address]  Urinalysis, Routine w reflex microscopic  Status: None   Collection Time: 09/14/19  9:28 PM  Result Value Ref Range   Color, Urine YELLOW YELLOW   APPearance CLEAR CLEAR   Specific Gravity, Urine 1.024 1.005 - 1.030   pH 6.0 5.0 - 8.0   Glucose, UA NEGATIVE NEGATIVE mg/dL   Hgb urine dipstick NEGATIVE NEGATIVE   Bilirubin Urine NEGATIVE NEGATIVE   Ketones, ur NEGATIVE NEGATIVE mg/dL   Protein, ur NEGATIVE NEGATIVE mg/dL   Nitrite NEGATIVE NEGATIVE   Leukocytes,Ua NEGATIVE NEGATIVE    Comment: Performed at [facility], [address] [place], [place] [address]  SARS Coronavirus 2 by RT PCR (hospital order, performed in [facility] hospital lab) Nasopharyngeal Nasopharyngeal Swab     Status: None   Collection Time: 09/15/19  5:03 AM   Specimen: Nasopharyngeal Swab  Result Value Ref Range   SARS Coronavirus 2 NEGATIVE NEGATIVE    Comment: (NOTE) SARS-CoV-2 target nucleic acids are NOT DETECTED. The SARS-CoV-2 RNA is generally detectable in upper and lower respiratory specimens during the acute phase of infection. The lowest concentration of SARS-CoV-2 viral copies this assay can  detect is 250 copies / mL. A negative result does not preclude SARS-CoV-2 infection and should not be used as the sole basis for treatment or other patient management decisions.  A negative result may occur with improper specimen collection / handling, submission of specimen other than nasopharyngeal swab, presence of viral mutation(s) within the areas targeted by this assay, and inadequate number of viral copies (<250 copies / mL). A negative result must be combined with clinical observations, patient history, and epidemiological information. Fact Sheet for Patients:   BoilerBrush.com.cy Fact Sheet for Healthcare Providers: https://pope.com/ This test is not yet approved or cleared  by the [place] FDA and has been authorized for detection and/or diagnosis of SARS-CoV-2 by FDA under an Emergency Use Authorization (EUA).  This EUA will remain in effect (meaning this test can be used) for the duration of the COVID-19 declaration under Section 564(b)(1) of the Act, 21 U.S.C. section 360bbb-3(b)(1), unless the authorization is terminated or revoked sooner. Performed at [facility], [address], [place], [place] [address]    CT ABDOMEN PELVIS W CONTRAST  Result Date: 09/15/2019 CLINICAL DATA:  Right lower quadrant pain with appendicitis suspected EXAM: CT ABDOMEN AND PELVIS WITH CONTRAST TECHNIQUE: Multidetector CT imaging of the abdomen and pelvis was performed using the standard protocol following bolus administration of intravenous contrast. CONTRAST:  OMNIPAQUE IOHEXOL 300 MG/ML  SOLN COMPARISON:  None. FINDINGS: Lower chest:  No contributory findings. Hepatobiliary: No focal liver abnormality.No evidence of biliary obstruction or stone. Pancreas: Unremarkable. Spleen: Unremarkable. Adrenals/Urinary Tract: Negative adrenals. No hydronephrosis or stone. Unremarkable bladder. Stomach/Bowel: Thickened and hypervascular  appendix with submucosal low-density edematous appearance. Outer wall diameter is 13 mm and there is mesoappendiceal stranding. No perforation or abscess. The appendix is in typical location extending inferiorly from the cecum in the right lower quadrant. No appendicolith. No ileus Vascular/Lymphatic: No acute vascular abnormality. No mass or adenopathy. Reproductive:No pathologic findings. Other: No ascites or pneumoperitoneum. Musculoskeletal: No acute abnormalities. IMPRESSION: Acute, non-perforated appendicitis. The appendix is in typical location with no appendicolith. Electronically Signed   By: Marnee Spring M.D.   On: 09/15/2019 04:35    Review of Systems  Constitutional: Negative for chills and fever.  HENT: Negative for ear discharge, hearing loss and sore throat.   Eyes: Negative for discharge.  Respiratory: Negative for cough and shortness of breath.  Cardiovascular: Negative for chest pain and leg swelling.  Gastrointestinal: Positive for abdominal pain. Negative for constipation, diarrhea, nausea and vomiting.  Musculoskeletal: Negative for myalgias and neck pain.  Skin: Negative for rash.  Allergic/Immunologic: Negative for environmental allergies.  Neurological: Negative for dizziness and seizures.  Hematological: Does not bruise/bleed easily.  Psychiatric/Behavioral: Negative for suicidal ideas.  All other systems reviewed and are negative.   Blood pressure (!) 141/89, pulse 69, temperature 98.3 F (36.8 C), temperature source Oral, resp. rate 16, SpO2 100 %. Physical Exam  Constitutional: He is oriented to person, place, and time. Vital signs are normal. He appears well-developed and well-nourished.  Conversant No acute distress  Eyes: Lids are normal. No scleral icterus.  Pupils are equal round and reactive No lid lag Moist conjunctiva  Neck: No tracheal tenderness present. No thyromegaly present.  No cervical lymphadenopathy  Cardiovascular: Normal rate, regular  rhythm and intact distal pulses.  No murmur heard. Respiratory: Effort normal and breath sounds normal. He has no wheezes. He has no rales.  GI: There is no hepatosplenomegaly. There is abdominal tenderness. There is tenderness at McBurney's point. No hernia.  Neurological: He is alert and oriented to person, place, and time.  Normal gait and station  Skin: Skin is warm. No rash noted. No cyanosis. Nails show no clubbing.  Normal skin turgor  Psychiatric: Judgment normal.  Appropriate affect     Assessment/Plan 34 year old male with acute appendicitis.  1.  We will proceed to the operating today for laparoscopic appendectomy.   I will discus with Dr. Craig Guess 2.  Agree with antibiotics.    Ralene Ok, MD 09/15/2019, 6:37 AM

## 2019-09-16 LAB — SURGICAL PATHOLOGY

## 2019-09-18 NOTE — Discharge Summary (Signed)
    Patient ID: Grant Pugh [id number] May 18, 1985 34 y.o.  Admit date: 09/14/2019 Discharge date: 09/18/2019  Admitting Diagnosis: appendicitis  Discharge Diagnosis Patient Active Problem List   Diagnosis Date Noted  . Acute appendicitis 09/15/2019    Consultants none  Reason for Admission: appendicitis  Procedures Lap appy  Hospital Course:  79M with acute appendicitis, s/p lap appy. Expected post-op course.   Physical Exam: Gen: comfortable, no distress Neuro: non-focal exam HEENT: PERRL Neck: supple CV: RRR Pulm: unlabored breathing Abd: soft, expected tenderness, lap appy sites dressed with dermabond GU: clear yellow urine Extr: wwp, no edema   Allergies as of 09/15/2019      Reactions   Banana Rash      Medication List    STOP taking these medications   cephALEXin 500 MG capsule Commonly known as: KEFLEX   cyclobenzaprine 10 MG tablet Commonly known as: FLEXERIL   naproxen 500 MG tablet Commonly known as: Naprosyn   oxyCODONE-acetaminophen 5-325 MG tablet Commonly known as: PERCOCET/ROXICET     TAKE these medications   ibuprofen 800 MG tablet Commonly known as: ADVIL Take 1 tablet (800 mg total) by mouth every 8 (eight) hours as needed.   oxyCODONE 5 MG immediate release tablet Commonly known as: Roxicodone Take 1 tablet (5 mg total) by mouth every 4 (four) hours as needed. Alternate doses with tylenol/ibuprofen           Signed: Diamantina Monks, MD [facility] 09/18/2019, 8:49 AM

## 2020-01-18 ENCOUNTER — Other Ambulatory Visit: Payer: Self-pay

## 2020-01-18 ENCOUNTER — Ambulatory Visit (INDEPENDENT_AMBULATORY_CARE_PROVIDER_SITE_OTHER): Payer: Self-pay

## 2020-01-18 DIAGNOSIS — Z23 Encounter for immunization: Secondary | ICD-10-CM

## 2020-01-18 NOTE — Progress Notes (Signed)
   Covid-19 Vaccination Clinic  Name:  Grant Pugh    MRN: [id number] DOB: 18-Nov-1985  01/18/2020  [name] was observed post Covid-19 immunization for 15 minutes without incident. He was provided with Vaccine Information Sheet and instruction to access the V-Safe system.   [name] was instructed to call 911 with any severe reactions post vaccine: Marland Kitchen Difficulty breathing  . Swelling of face and throat  . A fast heartbeat  . A bad rash all over body  . Dizziness and weakness   Immunizations Administered    Name Date Dose VIS Date Route   Pfizer COVID-19 Vaccine 01/18/2020 11:45 AM 0.3 mL 06/23/2018 Intramuscular   Manufacturer: [employer], [employer]   Lot: [id number]   NDC: [address]-5

## 2020-02-12 ENCOUNTER — Ambulatory Visit: Payer: Self-pay

## 2020-07-25 IMAGING — CT CT ABD-PELV W/ CM
2 of 4 series · 16 of 46 positions shown, 18 images · IV contrast (omnipaque)
Comparison: None.

CLINICAL DATA: Right lower quadrant pain with appendicitis
suspected

EXAM:
CT ABDOMEN AND PELVIS WITH CONTRAST
TECHNIQUE: Multidetector CT imaging of the abdomen and pelvis was performed
using the standard protocol following bolus administration of
intravenous contrast.
CONTRAST:  100mL OMNIPAQUE IOHEXOL 300 MG/ML  SOLN

[Series 3: a/p w/ 5mm · axial · 0.86mm/px · z∈[+860,+1320]mm · 13 of 100 slices shown, 15 images]
[im 4/100  soft-tissue]
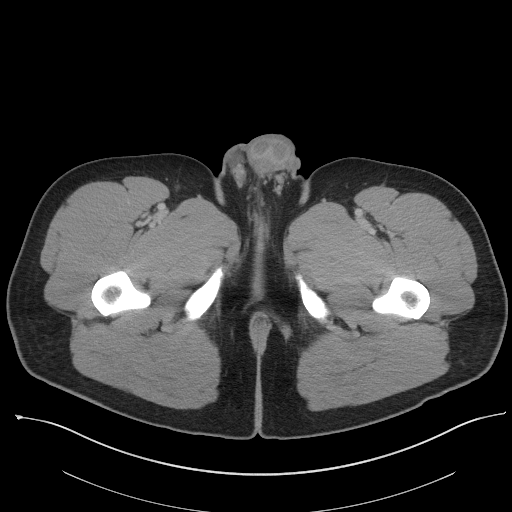
[im 4/100  bone]
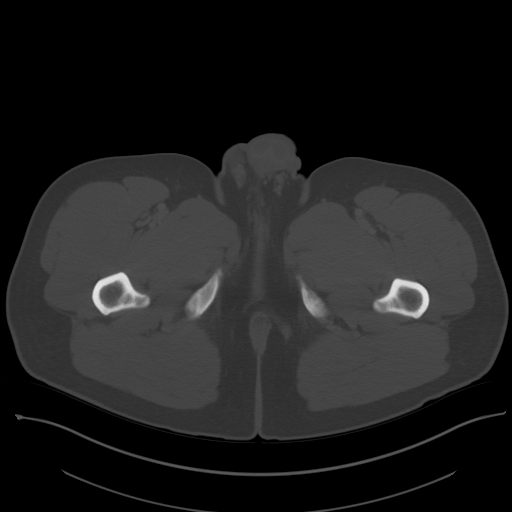
[im 12/100  soft-tissue]
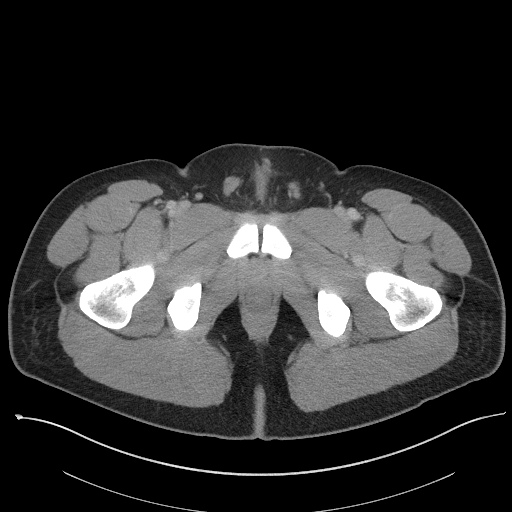
[im 20/100  soft-tissue]
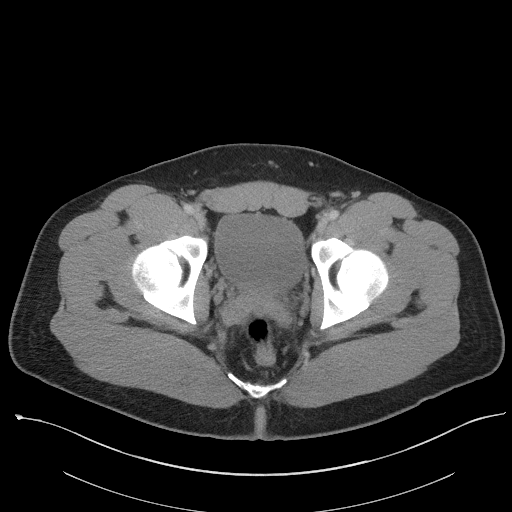
[im 28/100  soft-tissue]
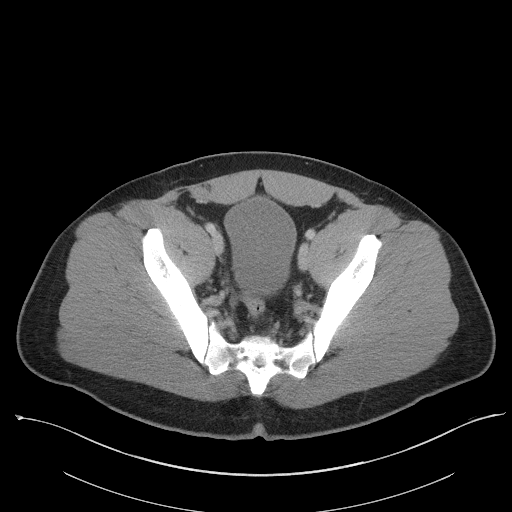
[im 36/100  soft-tissue]
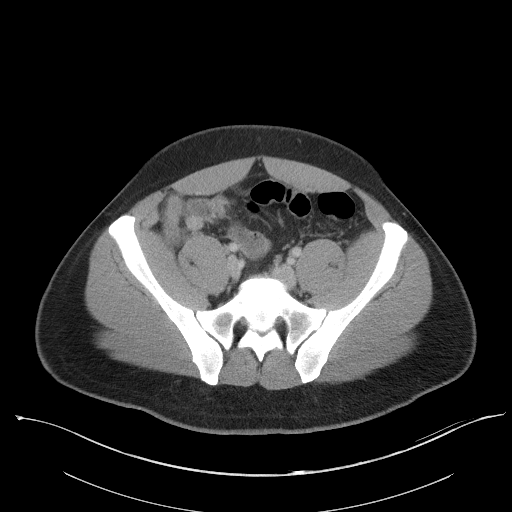
[im 44/100  soft-tissue]
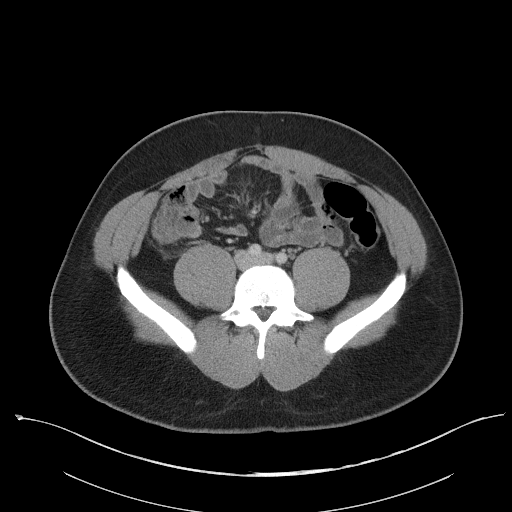
[im 52/100  soft-tissue]
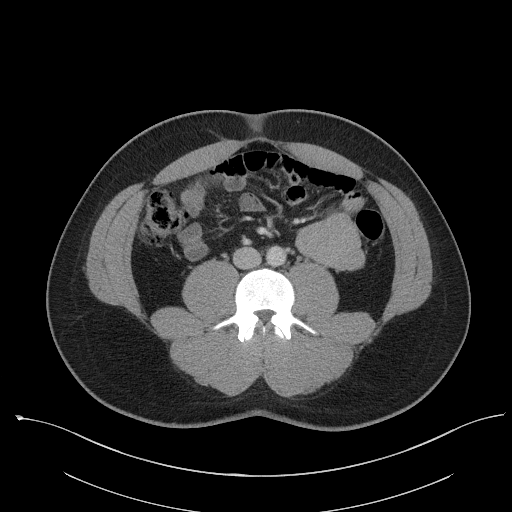
[im 56/100  soft-tissue]
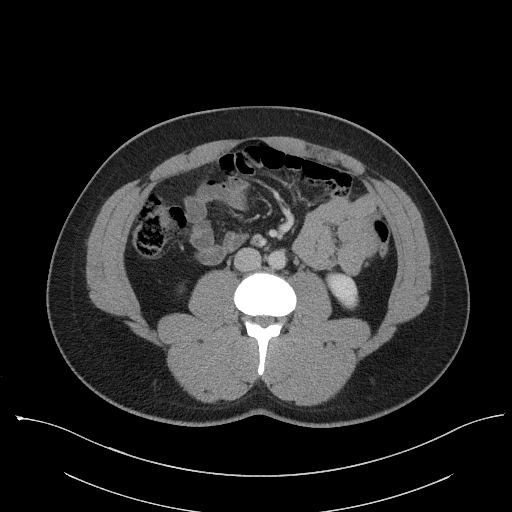
[im 64/100  soft-tissue]
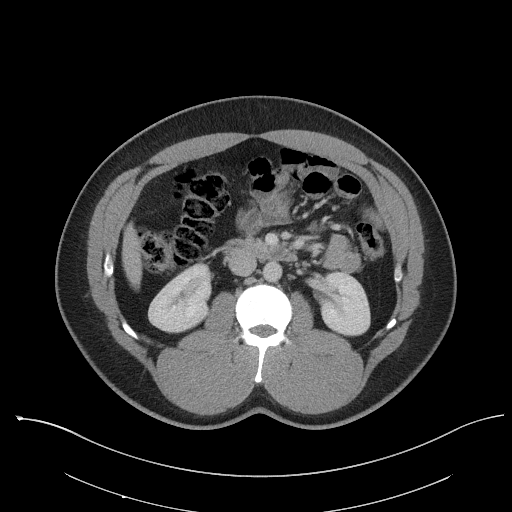
[im 64/100  bone]
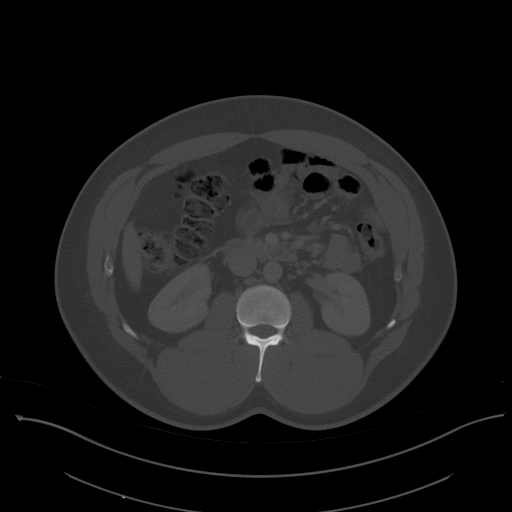
[im 72/100  soft-tissue]
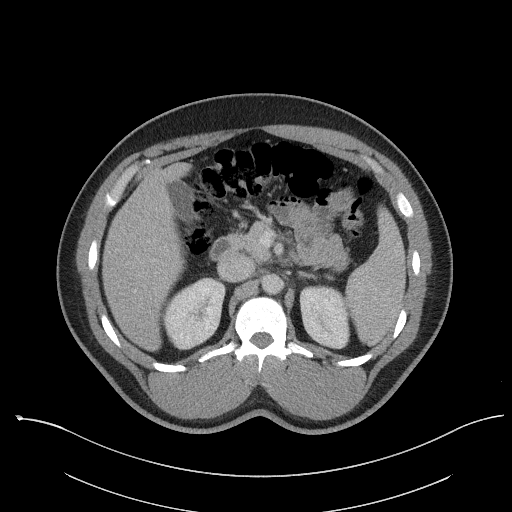
[im 80/100  soft-tissue]
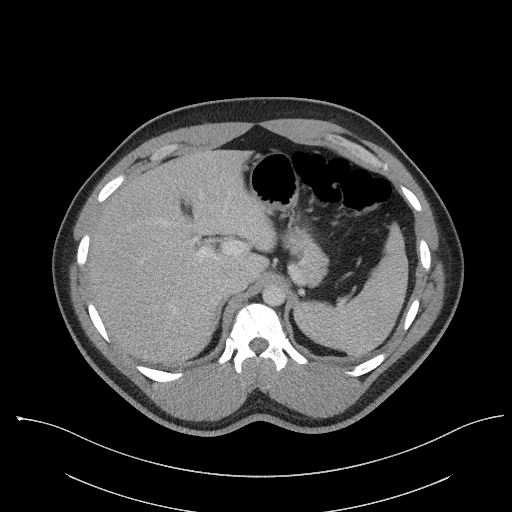
[im 88/100  soft-tissue]
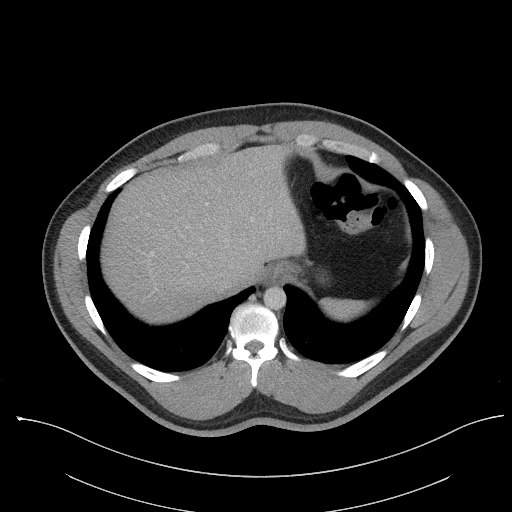
[im 96/100  soft-tissue]
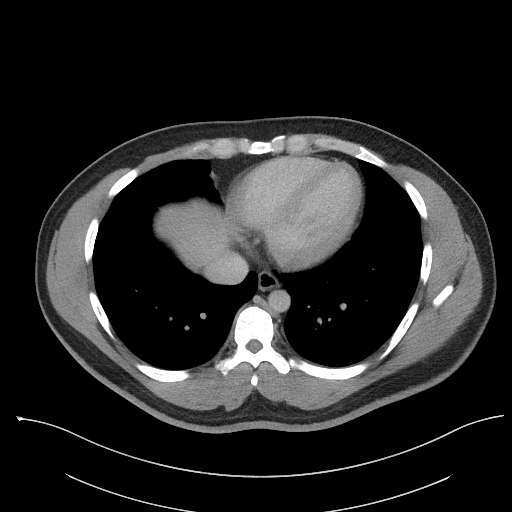

[Series 6: a/p w/ cor · coronal · 0.97mm/px · 3 of 131 slices shown]
[im 44/131  soft-tissue]
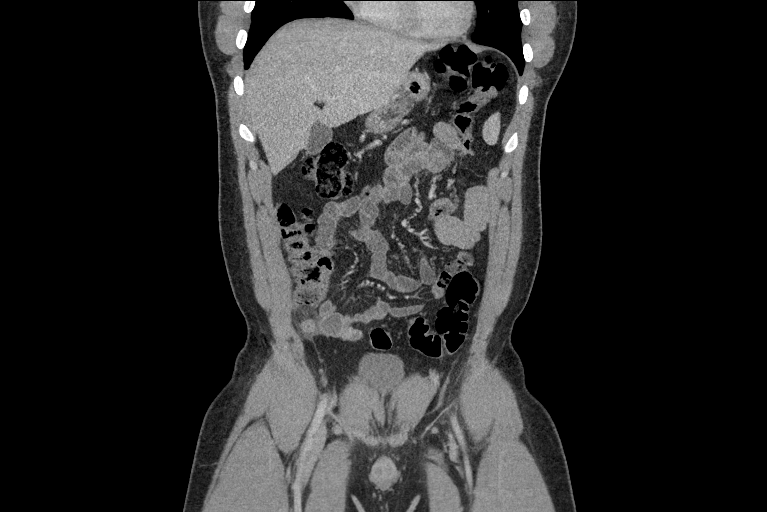
[im 58/131  soft-tissue]
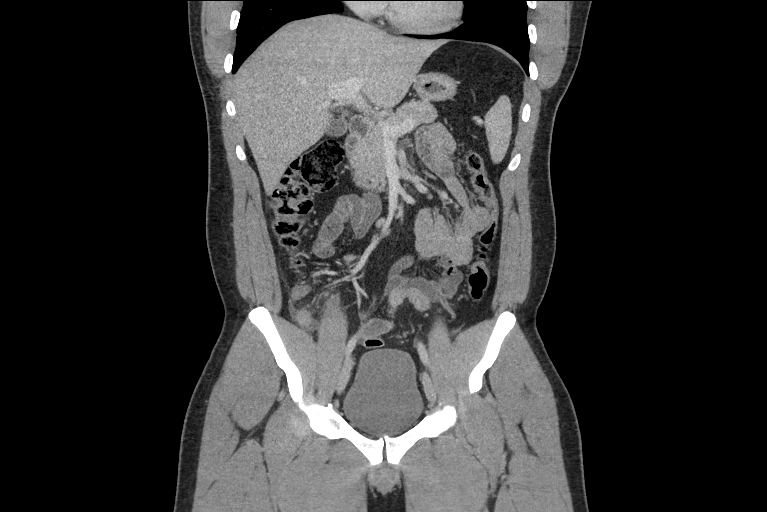
[im 73/131  soft-tissue]
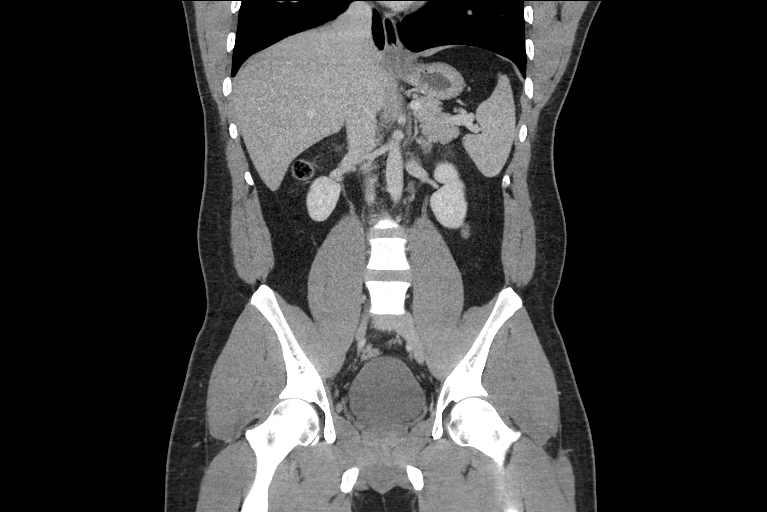

[16 of 46 positions shown; findings below may reference images not displayed]

FINDINGS: Lower chest:  No contributory findings.

Hepatobiliary: No focal liver abnormality.No evidence of biliary
obstruction or stone.

Pancreas: Unremarkable.

Spleen: Unremarkable.

Adrenals/Urinary Tract: Negative adrenals. No hydronephrosis or
stone. Unremarkable bladder.

Stomach/Bowel: Thickened and hypervascular appendix with submucosal
low-density edematous appearance. Outer wall diameter is 13 mm and
there is mesoappendiceal stranding. No perforation or abscess. The
appendix is in typical location extending inferiorly from the cecum
in the right lower quadrant. No appendicolith. No ileus

Vascular/Lymphatic: No acute vascular abnormality. No mass or
adenopathy.

Reproductive:No pathologic findings.

Other: No ascites or pneumoperitoneum.

Musculoskeletal: No acute abnormalities.
IMPRESSION: Acute, non-perforated appendicitis. The appendix is in typical
location with no appendicolith.

## 2020-11-25 ENCOUNTER — Ambulatory Visit: Payer: 59

## 2020-12-16 ENCOUNTER — Ambulatory Visit: Payer: 59 | Attending: Family Medicine | Admitting: Rehabilitative and Restorative Service Providers"

## 2020-12-16 ENCOUNTER — Encounter: Payer: Self-pay | Admitting: Rehabilitative and Restorative Service Providers"

## 2020-12-16 ENCOUNTER — Other Ambulatory Visit: Payer: Self-pay

## 2020-12-16 DIAGNOSIS — M6281 Muscle weakness (generalized): Secondary | ICD-10-CM | POA: Insufficient documentation

## 2020-12-16 DIAGNOSIS — G8929 Other chronic pain: Secondary | ICD-10-CM | POA: Diagnosis present

## 2020-12-16 DIAGNOSIS — R293 Abnormal posture: Secondary | ICD-10-CM | POA: Insufficient documentation

## 2020-12-16 DIAGNOSIS — M25511 Pain in right shoulder: Secondary | ICD-10-CM | POA: Insufficient documentation

## 2020-12-16 NOTE — Patient Instructions (Addendum)
Access Code: [email or web address] URL: [email or web address] Date: 12/16/2020 Prepared by: Thornell Sartorius  Exercises Child's Pose Tricep Stretch and Knees Apart - 2 x daily - 7 x weekly - 2 sets - 3 reps - 30 sec hold Corner Pec Major Stretch - 2 x daily - 7 x weekly - 2 sets - 3 reps - 30 sec hold Seated Scapular Retraction - 2 x daily - 7 x weekly - 2 sets - 10 reps  Advised pt to not lift upper body at the gym until PT can instruct on proper form and muscle facilitation prior and can perform without pain in clinic

## 2020-12-16 NOTE — Therapy (Signed)
[facility] [address] [place], [place], [address] Phone: [phone]   Fax:  [phone]  Physical Therapy Evaluation  Patient Details  Name: Grant Pugh MRN: [id number] Date of Birth: 12-31-1985 Referring Provider (PT): Dr. Cheri Rous, MD   Encounter Date: 12/16/2020   PT End of Session - 12/16/20 1155     Visit Number 1    Number of Visits 12    Date for PT Re-Evaluation 01/27/21    Authorization Type U[employer]    Progress Note Due on Visit 10    PT Start Time 1030    PT Stop Time 1122    PT Time Calculation (min) 52 min    Activity Tolerance Patient tolerated treatment well;No increased pain    Behavior During Therapy [facility] for tasks assessed/performed             History reviewed. No pertinent past medical history.  Past Surgical History:  Procedure Laterality Date   HERNIA REPAIR     LAPAROSCOPIC APPENDECTOMY N/A 09/15/2019   Procedure: LAPAROSCOPIC APPENDECTOMY;  Surgeon: Diamantina Monks, MD;  Location: [facility] OR;  Service: General;  Laterality: N/A;    There were no vitals filed for this visit.    Subjective Assessment - 12/16/20 1036     Subjective "I feel I have full range of motion in my right shoulder now but certain positions are painful (pushup or pushing motion, horiz abdct, impingement position shown). I need to be able to lift things for work"    Pertinent History About 4 months ago, pt was bench pressing when his right arm began to crack and pop; he discontinued the exercise and attempted another but was unable to lift it.    Limitations Lifting    How long can you sit comfortably? indeterminate amount of time    How long can you stand comfortably? indeterminate amount of time    How long can you walk comfortably? indeterminate amount of time    Diagnostic tests n/a    Patient Stated Goals "to get back to the gym"    Currently in Pain? Yes    Pain Score 2     Pain Location Shoulder    Pain  Orientation Right    Pain Descriptors / Indicators Aching    Pain Type Chronic pain    Pain Radiating Towards doesn't radiate; stays localized to R posterior (Teres)    Pain Onset More than a month ago    Pain Frequency Intermittent    Aggravating Factors  horiz abdct, end range shoulder flexion, holding son    Pain Relieving Factors not performing aggravating factors    Effect of Pain on Daily Activities uses contralateral UE    Multiple Pain Sites No                [facility] PT Assessment - 12/16/20 0001       Assessment   Medical Diagnosis R shoulder pain    Referring Provider (PT) Dr. Cheri Rous, MD    Onset Date/Surgical Date 08/09/20    Hand Dominance Right    Next MD Visit TBD    Prior Therapy none      Precautions   Precautions None      Restrictions   Weight Bearing Restrictions No      Balance Screen   Has the patient fallen in the past 6 months No    Has the patient had a decrease in activity level because of a fear  of falling?  No    Is the patient reluctant to leave their home because of a fear of falling?  No      Home [employer] information [employer] manager residence      Prior Function   Level of Independence Independent      Cognition   Overall Cognitive Status Within Functional Limits for tasks assessed      Observation/Other Assessments   Focus on Therapeutic Outcomes (FOTO)  59% function      Sensation   Light Touch Appears Intact      Coordination   Gross Motor Movements are Fluid and Coordinated Yes      Posture/Postural Control   Posture Comments L lateral trunk lean; rounded shoulders bil      ROM / Strength   AROM / PROM / Strength AROM;Strength;PROM      AROM   Overall AROM Comments Bil shoulder AROM WNL and = with Upper Trap substitution R with shoulder flexion/abduction to reach end ROM      PROM   Overall PROM Comments r shoulder PROM without pain      Strength   Overall Strength Comments L UE strength 5/5; R 4+/5  with pain at Teres all directions with ER most painful and strength at 3+/5      Palpation   Palpation comment R Teres major and minor tight, R Lat tight; spinal mobility good; R pec tighter; R Upper Trap tighter, R mid delt more pronounced, R scapula lower, r scapular winging and increased scapula upward rotation; increased R lateral scapular glide and upward rotation with abduction R      Special Tests   Other special tests Hawkins +, Cross Over impingement -; Lift off -, end range shoulder PROM with overpressure -; apprehension -; sulcus -                        Objective measurements completed on examination: See above findings.                    PT Long Term Goals - 12/16/20 1042       PT LONG TERM GOAL #1   Title Pt will be indep with advanced HEP in prep for discharge    Baseline initiated at eval    Time 6    Period Weeks    Status New    Target Date 01/27/21      PT LONG TERM GOAL #2   Title Pt will be able to pick up small son using R UE with </=2/10 pain    Baseline unable    Time 6    Period Weeks    Status New    Target Date 01/27/21      PT LONG TERM GOAL #3   Title Pt will be able to perform bench press without R shoulder pain    Baseline unable    Time 6    Period Weeks    Status New    Target Date 01/27/21      PT LONG TERM GOAL #4   Title Pt will demo improved R shoulder ER to 4+/5 without pain    Baseline 3+/5 with pain    Time 6    Period Weeks    Status New    Target Date 01/27/21      PT LONG TERM GOAL #5   Title Pt will have improved foto to 77% function  Baseline 59% function    Time 6    Period Weeks    Status New    Target Date 01/27/21                    Plan - 12/16/20 1121     Clinical Impression Statement Pt presents to PT with R shoulder pain exhibiting impingement syndrome with assessments of R Teres major and minor tight, R Lat tight;  R pec tighter; R Upper Trap tighter, R mid delt  more pronounced, R scapula lower, R scapular winging and increased scapula upward rotation; increased R lateral scapular glide and upward rotation with abduction R. Pt would benefit from manual therapy and therex to address the above deficits, to include scapular strengthening, and improve location and movement of R scapula. Once pain improved, he needs to be educated on proper muscle facilitation prior to return to his normal gym activities.    Examination-Activity Limitations Lift;Reach Overhead    Examination-Participation Restrictions Occupation;Other   caring for son; gym   Stability/Clinical Decision Making Stable/Uncomplicated    Clinical Decision Making Low    Rehab Potential Excellent    PT Frequency 2x / week    PT Duration 6 weeks    PT Treatment/Interventions ADLs/Self Care Home Management;Cryotherapy;Ultrasound;Moist Heat;Iontophoresis 4mg /ml Dexamethasone;Electrical Stimulation;Therapeutic exercise;Therapeutic activities;Functional mobility training;Neuromuscular re-education;Patient/family education;Manual techniques;Passive range of motion;Dry needling;Taping    PT Next Visit Plan review HEP; manual therapy to Teres and Lat for muscular tightness, therex for scapular strengthening, pec/Teres/Lat stretching    PT Home Exercise Plan [id number]    Consulted and Agree with Plan of Care Patient             Patient will benefit from skilled therapeutic intervention in order to improve the following deficits and impairments:  Decreased endurance, Hypomobility, Decreased coordination, Decreased strength, Impaired flexibility, Impaired UE functional use, Postural dysfunction, Pain  Visit Diagnosis: Chronic right shoulder pain - Plan: PT plan of care cert/re-cert  Abnormal posture - Plan: PT plan of care cert/re-cert  Muscle weakness (generalized) - Plan: PT plan of care cert/re-cert     Problem List Patient Active Problem List   Diagnosis Date Noted   Acute appendicitis  09/15/2019    09/17/2019, PT, DPT 12/16/2020, 11:56 AM  [facility] [address] [place], [place], [place] Phone: [phone]   Fax:  [phone]  Name: Grant Pugh MRN: Precious Gilding Date of Birth: [birth year]

## 2020-12-18 ENCOUNTER — Ambulatory Visit: Payer: 59 | Admitting: Physical Therapy

## 2020-12-25 ENCOUNTER — Ambulatory Visit: Payer: 59

## 2020-12-25 ENCOUNTER — Other Ambulatory Visit: Payer: Self-pay

## 2020-12-25 DIAGNOSIS — M25511 Pain in right shoulder: Secondary | ICD-10-CM | POA: Diagnosis not present

## 2020-12-25 DIAGNOSIS — M6281 Muscle weakness (generalized): Secondary | ICD-10-CM

## 2020-12-25 DIAGNOSIS — G8929 Other chronic pain: Secondary | ICD-10-CM

## 2020-12-25 DIAGNOSIS — R293 Abnormal posture: Secondary | ICD-10-CM

## 2020-12-25 NOTE — Therapy (Signed)
[facility] [address] [place], [place], [address] Phone: [phone]   Fax:  [phone]  Physical Therapy Treatment  Patient Details  Name: Grant Pugh MRN: [id number] Date of Birth: May 28, 1985 Referring Provider (PT): Dr. Cheri Rous, MD   Encounter Date: 12/25/2020   PT End of Session - 12/25/20 1335     Visit Number 2    Number of Visits 12    Date for PT Re-Evaluation 01/27/21    Authorization Type UHC    Progress Note Due on Visit 10    PT Start Time 1335    PT Stop Time 1413    PT Time Calculation (min) 38 min    Activity Tolerance Patient tolerated treatment well    Behavior During Therapy [facility] for tasks assessed/performed             History reviewed. No pertinent past medical history.  Past Surgical History:  Procedure Laterality Date   HERNIA REPAIR     LAPAROSCOPIC APPENDECTOMY N/A 09/15/2019   Procedure: LAPAROSCOPIC APPENDECTOMY;  Surgeon: Diamantina Monks, MD;  Location: [facility] OR;  Service: General;  Laterality: N/A;    There were no vitals filed for this visit.   Subjective Assessment - 12/25/20 1336     Subjective Patient reports his shoulder is a little sore from sleeping on that side. He reports compliance with HEP.    Pertinent History About 4 months ago, pt was bench pressing when his right arm began to crack and pop; he discontinued the exercise and attempted another but was unable to lift it.    Limitations Lifting    How long can you sit comfortably? indeterminate amount of time    How long can you stand comfortably? indeterminate amount of time    How long can you walk comfortably? indeterminate amount of time    Diagnostic tests n/a    Patient Stated Goals "to get back to the gym"    Currently in Pain? Yes    Pain Score 4     Pain Location Shoulder    Pain Orientation Right;Lateral    Pain Descriptors / Indicators Sore    Pain Type Chronic pain    Pain Onset More than a month ago     Pain Frequency Constant    Aggravating Factors  work, playing with son    Pain Relieving Factors nothing                    [facility] Adult PT Treatment/Exercise:  Therapeutic Exercise: - sleeper stretch 3 x 30 sec Rt  - prone scapular retraction 2 x 15  - scapular retraction 1 x 5 - serratus wall slide attempted pain - ER isometric Rt 2 x 10; 5 sec hold  IR isometric Rt 2 x 10; 5 sec hold   Manual Therapy: - STM/Trigger point release posterior rotator cuff Rt - shoulder PROM to tolerance Rt - Rt GHJ mobilization inferior, posterior and anterior grade II-III  Neuromuscular re-ed: - n/a  Therapeutic Activity: - n/a  Self-care/Home Management: - see patient education                     PT Education - 12/25/20 1400     Education Details Updated HEP. issued tennis ball for self soft tissue mobilization    Person(s) Educated Patient    Methods Explanation;Demonstration;Verbal cues;Handout    Comprehension Verbalized understanding;Returned demonstration;Verbal cues required  PT Long Term Goals - 12/16/20 1042       PT LONG TERM GOAL #1   Title Pt will be indep with advanced HEP in prep for discharge    Baseline initiated at eval    Time 6    Period Weeks    Status New    Target Date 01/27/21      PT LONG TERM GOAL #2   Title Pt will be able to pick up small son using R UE with </=2/10 pain    Baseline unable    Time 6    Period Weeks    Status New    Target Date 01/27/21      PT LONG TERM GOAL #3   Title Pt will be able to perform bench press without R shoulder pain    Baseline unable    Time 6    Period Weeks    Status New    Target Date 01/27/21      PT LONG TERM GOAL #4   Title Pt will demo improved R shoulder ER to 4+/5 without pain    Baseline 3+/5 with pain    Time 6    Period Weeks    Status New    Target Date 01/27/21      PT LONG TERM GOAL #5   Title Pt will have improved foto to 77%  function    Baseline 59% function    Time 6    Period Weeks    Status New    Target Date 01/27/21                   Plan - 12/25/20 1338     Clinical Impression Statement Trigger point present in teres major/minor with partial release from manual therapy. Patient noted to have hypomobility about the Rt GHJ most notable in the inferior direction. Able to progress periscapular strengthening with patient tolerating thex ex well with exception of serratus wall slides as this caused increased pain. Able to begin isometric rotator cuff strengthening with patient quickly fatiguing.    PT Treatment/Interventions ADLs/Self Care Home Management;Cryotherapy;Ultrasound;Moist Heat;Iontophoresis 4mg /ml Dexamethasone;Electrical Stimulation;Therapeutic exercise;Therapeutic activities;Functional mobility training;Neuromuscular re-education;Patient/family education;Manual techniques;Passive range of motion;Dry needling;Taping    PT Next Visit Plan review HEP; manual therapy to Teres and Lat for muscular tightness, therex for scapular strengthening, pec/Teres/Lat stretching    PT Home Exercise Plan [id number]             Patient will benefit from skilled therapeutic intervention in order to improve the following deficits and impairments:  Decreased endurance, Hypomobility, Decreased coordination, Decreased strength, Impaired flexibility, Impaired UE functional use, Postural dysfunction, Pain  Visit Diagnosis: Chronic right shoulder pain  Abnormal posture  Muscle weakness (generalized)     Problem List Patient Active Problem List   Diagnosis Date Noted   Acute appendicitis 09/15/2019   09/17/2019, PT, DPT, ATC 12/25/20 2:20 PM   [facility] [address] [place], [place], [place] Phone: [phone]   Fax:  [phone]  Name: Grant Pugh MRN: Grant Pugh Date of Birth: 1985-05-31

## 2020-12-27 ENCOUNTER — Other Ambulatory Visit: Payer: Self-pay

## 2020-12-27 ENCOUNTER — Ambulatory Visit: Payer: 59

## 2020-12-27 DIAGNOSIS — M6281 Muscle weakness (generalized): Secondary | ICD-10-CM

## 2020-12-27 DIAGNOSIS — G8929 Other chronic pain: Secondary | ICD-10-CM

## 2020-12-27 DIAGNOSIS — M25511 Pain in right shoulder: Secondary | ICD-10-CM | POA: Diagnosis not present

## 2020-12-27 DIAGNOSIS — R293 Abnormal posture: Secondary | ICD-10-CM

## 2020-12-27 NOTE — Therapy (Signed)
[facility] [address] [place], [place], [address] Phone: [phone]   Fax:  [phone]  Physical Therapy Treatment  Patient Details  Name: Grant Pugh MRN: [id number] Date of Birth: Nov 15, 1985 Referring Provider (PT): Dr. Cheri Rous, MD   Encounter Date: 12/27/2020   PT End of Session - 12/27/20 1232     Visit Number 3    Number of Visits 12    Date for PT Re-Evaluation 01/27/21    Authorization Type UHC    Progress Note Due on Visit 10    PT Start Time 1231    PT Stop Time 1312    PT Time Calculation (min) 41 min    Activity Tolerance Patient tolerated treatment well    Behavior During Therapy [facility] for tasks assessed/performed             History reviewed. No pertinent past medical history.  Past Surgical History:  Procedure Laterality Date   HERNIA REPAIR     LAPAROSCOPIC APPENDECTOMY N/A 09/15/2019   Procedure: LAPAROSCOPIC APPENDECTOMY;  Surgeon: Diamantina Monks, MD;  Location: [facility] OR;  Service: General;  Laterality: N/A;    There were no vitals filed for this visit.   Subjective Assessment - 12/27/20 1232     Subjective He reports the shoulder is feeling a little bit better after last session. He reports compliance HEP. No pain currently, but hurts when he moves it.    Pertinent History About 4 months ago, pt was bench pressing when his right arm began to crack and pop; he discontinued the exercise and attempted another but was unable to lift it.    Limitations Lifting    How long can you sit comfortably? indeterminate amount of time    How long can you stand comfortably? indeterminate amount of time    How long can you walk comfortably? indeterminate amount of time    Diagnostic tests n/a    Patient Stated Goals "to get back to the gym"    Currently in Pain? No/denies                [facility] PT Assessment - 12/27/20 0001       AROM   Right Shoulder Flexion --   full pain at end range    Right Shoulder ABduction --   full painful arc               [facility] Adult PT Treatment/Exercise:   Therapeutic Exercise:   - resisted shoulder extension 2  x10 red band  - rows 2 x 10 black band  ER/IR reactive isometric green band 1 x 10 RUE  - pec stretch on roller 60 seconds   Not today* - sleeper stretch 3 x 30 sec Rt  - prone scapular retraction 2 x 15  - scapular retraction 1 x 5 - serratus wall slide attempted pain - ER isometric Rt 2 x 10; 5 sec hold  IR isometric Rt 2 x 10; 5 sec hold    Manual Therapy: - STM/Trigger point release posterior rotator cuff Rt - Rt GHJ mobilization inferior, posterior and anterior grade II-III   Neuromuscular re-ed: - n/a   Therapeutic Activity: - n/a   Self-care/Home Management: - n/a                         PT Long Term Goals - 12/16/20 1042       PT LONG TERM GOAL #1  Title Pt will be indep with advanced HEP in prep for discharge    Baseline initiated at eval    Time 6    Period Weeks    Status New    Target Date 01/27/21      PT LONG TERM GOAL #2   Title Pt will be able to pick up small son using R UE with </=2/10 pain    Baseline unable    Time 6    Period Weeks    Status New    Target Date 01/27/21      PT LONG TERM GOAL #3   Title Pt will be able to perform bench press without R shoulder pain    Baseline unable    Time 6    Period Weeks    Status New    Target Date 01/27/21      PT LONG TERM GOAL #4   Title Pt will demo improved R shoulder ER to 4+/5 without pain    Baseline 3+/5 with pain    Time 6    Period Weeks    Status New    Target Date 01/27/21      PT LONG TERM GOAL #5   Title Pt will have improved foto to 77% function    Baseline 59% function    Time 6    Period Weeks    Status New    Target Date 01/27/21                   Plan - 12/27/20 1253     Clinical Impression Statement Tautness and palpable tenderness present about teres major/minor and  infraspinatus with partial release from manual therapy. He has full shoulder flexion and abduction AROM on the RUE though has end range pain with flexion and painful arc with abduction. Able to progress periscapular strengthening and rotator cuff isometric without reports of pain requiring minimal postural cues.    PT Treatment/Interventions ADLs/Self Care Home Management;Cryotherapy;Ultrasound;Moist Heat;Iontophoresis 4mg /ml Dexamethasone;Electrical Stimulation;Therapeutic exercise;Therapeutic activities;Functional mobility training;Neuromuscular re-education;Patient/family education;Manual techniques;Passive range of motion;Dry needling;Taping    PT Next Visit Plan consider ionto;  manual therapy to Teres and Lat for muscular tightness, therex for scapular strengthening, pec/Teres/Lat stretching    PT Home Exercise Plan [id number]    Consulted and Agree with Plan of Care Patient             Patient will benefit from skilled therapeutic intervention in order to improve the following deficits and impairments:  Decreased endurance, Hypomobility, Decreased coordination, Decreased strength, Impaired flexibility, Impaired UE functional use, Postural dysfunction, Pain  Visit Diagnosis: Chronic right shoulder pain  Abnormal posture  Muscle weakness (generalized)     Problem List Patient Active Problem List   Diagnosis Date Noted   Acute appendicitis 09/15/2019  09/17/2019, PT, DPT, ATC 12/27/20 1:14 PM   [facility] [address] [place], [place], [place] Phone: [phone]   Fax:  [phone]  Name: Grant Pugh MRN: Grant Pugh Date of Birth: January 11, 1986

## 2021-01-04 ENCOUNTER — Other Ambulatory Visit: Payer: Self-pay | Admitting: Family Medicine

## 2021-01-04 ENCOUNTER — Ambulatory Visit: Payer: 59

## 2021-01-04 ENCOUNTER — Other Ambulatory Visit: Payer: Self-pay

## 2021-01-04 DIAGNOSIS — Z0000 Encounter for general adult medical examination without abnormal findings: Secondary | ICD-10-CM

## 2021-01-05 ENCOUNTER — Ambulatory Visit: Payer: 59

## 2021-01-16 ENCOUNTER — Ambulatory Visit: Payer: 59 | Attending: Family Medicine

## 2021-01-16 ENCOUNTER — Other Ambulatory Visit: Payer: Self-pay

## 2021-01-16 DIAGNOSIS — G8929 Other chronic pain: Secondary | ICD-10-CM | POA: Diagnosis present

## 2021-01-16 DIAGNOSIS — M6281 Muscle weakness (generalized): Secondary | ICD-10-CM | POA: Insufficient documentation

## 2021-01-16 DIAGNOSIS — R293 Abnormal posture: Secondary | ICD-10-CM | POA: Diagnosis present

## 2021-01-16 DIAGNOSIS — M25511 Pain in right shoulder: Secondary | ICD-10-CM | POA: Diagnosis present

## 2021-01-16 NOTE — Patient Instructions (Signed)

## 2021-01-16 NOTE — Therapy (Signed)
[facility] Outpatient Rehabilitation [facility] [address] [place], [place], [address] Phone: [phone]   Fax:  [phone]  Physical Therapy Treatment  Patient Details  Name: Grant Pugh MRN: [id number] Date of Birth: 1985/09/25 Referring Provider (PT): Dr. Cheri Rous, MD   Encounter Date: 01/16/2021   PT End of Session - 01/16/21 1315     Visit Number 4    Number of Visits 12    Date for PT Re-Evaluation 01/27/21    Authorization Type UHC    Progress Note Due on Visit 10    PT Start Time 1315    PT Stop Time 1400    PT Time Calculation (min) 45 min    Activity Tolerance Patient tolerated treatment well    Behavior During Therapy [facility] for tasks assessed/performed             History reviewed. No pertinent past medical history.  Past Surgical History:  Procedure Laterality Date   HERNIA REPAIR     LAPAROSCOPIC APPENDECTOMY N/A 09/15/2019   Procedure: LAPAROSCOPIC APPENDECTOMY;  Surgeon: Diamantina Monks, MD;  Location: [facility] OR;  Service: General;  Laterality: N/A;    There were no vitals filed for this visit.   Subjective Assessment - 01/16/21 1317     Subjective Patient reports the shoulder is feeling a little bit better. He feels that his motion is less painful. He attempted to do a couple push-ups and this was not as painful. He has not returned to the gym.    Currently in Pain? No/denies                [facility] PT Assessment - 01/16/21 0001       AROM   Right Shoulder Flexion --   full and pain free   Right Shoulder ABduction --   full painful arc     Strength   Right Shoulder Flexion 4+/5    Right Shoulder ABduction 5/5    Right Shoulder Internal Rotation 5/5    Right Shoulder External Rotation 4/5   pain     Special Tests   Other special tests (+) Hawkins (-) neers (-) Speeds                    [facility] Adult PT Treatment/Exercise:   Therapeutic Exercise:    -serratus punch with stability ball 2 x 15 - prone  scapular retraction 2 x 15  - sidelying ER RUE 2 x 10  - prone row RUE 2 x 15 5#  - resisted shoulder extension 2  x15 green band  - resisted adduction yellow band 2 x 10 RUE     Not today* - rows 2 x 10 black band  ER/IR reactive isometric green band 1 x 10 RUE  - pec stretch on roller 60 seconds  - sleeper stretch 3 x 30 sec Rt  - scapular retraction 1 x 5 - serratus wall slide attempted pain - ER isometric Rt 2 x 10; 5 sec hold  IR isometric Rt 2 x 10; 5 sec hold    Manual Therapy:  Not performed today* - STM/Trigger point release posterior rotator cuff Rt - Rt GHJ mobilization inferior, posterior and anterior grade II-III   Neuromuscular re-ed: - n/a   Therapeutic Activity: - n/a   Self-care/Home Management: - education on anatomy of current condition - education on iontophoresis indications, side effects  [facility] Adult PT Treatment/Exercise - 01/16/21 0001       Modalities   Modalities Iontophoresis      Iontophoresis   Type of Iontophoresis Dexamethasone    Location Rt shoulder    Dose 4mg /ml    Time 4 hour patch                          PT Long Term Goals - 12/16/20 1042       PT LONG TERM GOAL #1   Title Pt will be indep with advanced HEP in prep for discharge    Baseline initiated at eval    Time 6    Period Weeks    Status New    Target Date 01/27/21      PT LONG TERM GOAL #2   Title Pt will be able to pick up small son using R UE with </=2/10 pain    Baseline unable    Time 6    Period Weeks    Status New    Target Date 01/27/21      PT LONG TERM GOAL #3   Title Pt will be able to perform bench press without R shoulder pain    Baseline unable    Time 6    Period Weeks    Status New    Target Date 01/27/21      PT LONG TERM GOAL #4   Title Pt will demo improved R shoulder ER to 4+/5 without pain    Baseline 3+/5 with pain    Time 6    Period Weeks    Status New    Target Date 01/27/21       PT LONG TERM GOAL #5   Title Pt will have improved foto to 77% function    Baseline 59% function    Time 6    Period Weeks    Status New    Target Date 01/27/21                   Plan - 01/16/21 1342     Clinical Impression Statement Patient demonstrates full and pain free shoulder flexion AROM, though continues to have a painful arc of motion with abduction. He has continued weakness about the rotator cuff, specifically in ER and has positive impingement testing. Iontophoresis was utilized today to assist in overall pain reducation and inflammation as he symptoms are consistent with rotator cuff tendinopathy. Able to begin isotonic rotator cuff strengthening without reports of pain, though he quickly fatigues with sidelying ER. Overall good tolerance to progression of strengthening without reports of pain.    PT Treatment/Interventions ADLs/Self Care Home Management;Cryotherapy;Ultrasound;Moist Heat;Iontophoresis 4mg /ml Dexamethasone;Electrical Stimulation;Therapeutic exercise;Therapeutic activities;Functional mobility training;Neuromuscular re-education;Patient/family education;Manual techniques;Passive range of motion;Dry needling;Taping    PT Next Visit Plan assess response to ionto.  manual therapy to Teres and Lat for muscular tightness, therex for scapular strengthening, pec/Teres/Lat stretching    PT Home Exercise Plan [id number]    Consulted and Agree with Plan of Care Patient             Patient will benefit from skilled therapeutic intervention in order to improve the following deficits and impairments:  Decreased endurance, Hypomobility, Decreased coordination, Decreased strength, Impaired flexibility, Impaired UE functional use, Postural dysfunction, Pain  Visit Diagnosis: Chronic right shoulder pain  Abnormal posture  Muscle weakness (generalized)     Problem List Patient Active Problem List   Diagnosis Date Noted  Acute appendicitis 09/15/2019    Letitia Libra, PT, DPT, ATC 01/16/21 2:08 PM   [facility] [address] [place], [place], [address] Phone: [phone]   Fax:  [phone]  Name: Grant Pugh MRN: [id number] Date of Birth: 06/17/1985

## 2021-01-18 ENCOUNTER — Telehealth: Payer: Self-pay

## 2021-01-18 ENCOUNTER — Ambulatory Visit: Payer: 59

## 2021-01-18 NOTE — Telephone Encounter (Signed)
Left voicemail notifying patient of missed PT appointment. This was the last visit scheduled, so asked that patient call us back if he would like to schedule additional visits. Reviewed attendance policy.

## 2021-01-23 ENCOUNTER — Other Ambulatory Visit: Payer: Self-pay

## 2021-01-23 ENCOUNTER — Ambulatory Visit: Payer: 59

## 2021-01-23 DIAGNOSIS — M25511 Pain in right shoulder: Secondary | ICD-10-CM | POA: Diagnosis not present

## 2021-01-23 DIAGNOSIS — R293 Abnormal posture: Secondary | ICD-10-CM

## 2021-01-23 DIAGNOSIS — G8929 Other chronic pain: Secondary | ICD-10-CM

## 2021-01-23 DIAGNOSIS — M6281 Muscle weakness (generalized): Secondary | ICD-10-CM

## 2021-01-23 NOTE — Therapy (Signed)
[facility] Outpatient Rehabilitation [facility] [address] [place], [place], [address] Phone: [phone]   Fax:  [phone]  Physical Therapy Treatment/Re-certification  Patient Details  Name: Ovadia Lopp MRN: [id number] Date of Birth: 1986/01/07 Referring Provider (PT): Dr. Cheri Rous, MD   Encounter Date: 01/23/2021   PT End of Session - 01/23/21 1618     Visit Number 5    Number of Visits 17    Date for PT Re-Evaluation 03/10/21    Authorization Type UHC    PT Start Time 1617    PT Stop Time 1700    PT Time Calculation (min) 43 min    Activity Tolerance Patient tolerated treatment well    Behavior During Therapy [facility] for tasks assessed/performed             History reviewed. No pertinent past medical history.  Past Surgical History:  Procedure Laterality Date   HERNIA REPAIR     LAPAROSCOPIC APPENDECTOMY N/A 09/15/2019   Procedure: LAPAROSCOPIC APPENDECTOMY;  Surgeon: Diamantina Monks, MD;  Location: [facility] OR;  Service: General;  Laterality: N/A;    There were no vitals filed for this visit.   Subjective Assessment - 01/23/21 1618     Subjective Patient reports the shoulder is a little irritated today from work a lot of pulling and pushing movement. Patient reports subjective overall improvement of 45% feeling like he needs continued work on decreasing pain and progressing his strength. He reports the ionto did help to reduce his pain in the short term.    Currently in Pain? Yes    Pain Score 6     Pain Location Shoulder    Pain Orientation Right;Lateral    Pain Descriptors / Indicators Sharp    Pain Type Chronic pain    Pain Onset More than a month ago    Pain Frequency Intermittent    Aggravating Factors  work, repetitive movement    Pain Relieving Factors rest, ionto                [facility] PT Assessment - 01/23/21 0001       Assessment   Medical Diagnosis R shoulder pain    Referring Provider (PT) Dr. Cheri Rous, MD       Observation/Other Assessments   Focus on Therapeutic Outcomes (FOTO)  62% function      AROM   Right Shoulder Flexion --   full pain free   Right Shoulder ABduction --   full painful arc   Right Shoulder Internal Rotation --   pain posterior shoulder with functional IR   Right Shoulder External Rotation --   full and pain free     Strength   Overall Strength Comments scaption Rt 4+/5 pain with ER; mild pain with scaption. 5/5 strength Lt shoulder    Right Shoulder Flexion 4+/5    Right Shoulder ABduction 5/5    Right Shoulder Internal Rotation 5/5    Right Shoulder External Rotation 4/5      Palpation   Palpation comment TTP Rt infraspinatus      Special Tests   Other special tests (-) empty can (+) Leanord Asal (-) Neers (+) Painful arc (-) Drop Arm (-) Speeds                  [facility] Adult PT Treatment/Exercise:   Therapeutic Exercise:   - sidelying ER RUE 2 x 15      Not today* - prone row RUE 2 x 15 5#  -  resisted shoulder extension 2  x15 green band  - resisted adduction yellow band 2 x 10 RUE  - prone row RUE 2 x 15 5#  - resisted shoulder extension 2  x15 green band  - resisted adduction yellow band 2 x 10 RUE  - rows 2 x 10 black band  ER/IR reactive isometric green band 1 x 10 RUE  - pec stretch on roller 60 seconds  - sleeper stretch 3 x 30 sec Rt  - scapular retraction 1 x 5 - serratus wall slide attempted pain - ER isometric Rt 2 x 10; 5 sec hold  IR isometric Rt 2 x 10; 5 sec hold    Manual Therapy:   Not performed today* - STM/Trigger point release posterior rotator cuff Rt - Rt GHJ mobilization inferior, posterior and anterior grade II-III   Neuromuscular re-ed: - n/a   Therapeutic Activity: - n/a   Self-care/Home Management: - education on re-assessment findings, education on updated POC, education on shoulder anatomy and anatomy of current condition - education on FOTO score  - education on modalities for pain control -  education on limiting overhead activity currently - education on TPDN indications, side effects          [facility] Adult PT Treatment/Exercise - 01/23/21 0001       Iontophoresis   Type of Iontophoresis Dexamethasone    Location Rt shoulder    Dose 4mg /ml    Time 4 hour patch                     PT Education - 01/23/21 1805     Education Details see self care    Person(s) Educated Patient    Methods Explanation;Handout    Comprehension Verbalized understanding                 PT Long Term Goals - 01/23/21 1622       PT LONG TERM GOAL #1   Title Pt will be indep with advanced HEP in prep for discharge    Baseline initiated at eval    Time 6    Period Weeks    Status On-going      PT LONG TERM GOAL #2   Title Pt will be able to pick up small son using R UE with </=2/10 pain    Baseline picks him up rates pain as 7/10    Time 6    Period Weeks    Status On-going      PT LONG TERM GOAL #3   Title Pt will be able to perform bench press without R shoulder pain    Baseline unable    Time 6    Period Weeks    Status On-going      PT LONG TERM GOAL #4   Title Pt will demo improved R shoulder ER to 4+/5 without pain    Baseline 3+/5 with pain; 4/5 pain 9/27    Time 6    Period Weeks    Status On-going      PT LONG TERM GOAL #5   Title Pt will have improved foto to 77% function    Baseline 59% function; 62% 9/27    Time 6    Period Weeks    Status New                   Plan - 01/23/21 1636     Clinical Impression Statement Ronen has attended 5 PT sessions  since the start of care demonstrating improvements in his Rt shoulder strength and overall reduction in his shoulder pain. His signs and symptoms remain consistent with rotator cuff tendinopathy as he continues to have a painful arc of motion, weakness and pain with ER MMT, and palpable tenderness about the infraspinatus. We recently trialed iontophoresis with patient reporting  improvements in his pain with this modality. He will benefit from continued consistent PT to further assist in pain reduction and improve his strength in order to return to PLOF, which includes UE lifting at the gym.    PT Frequency 2x / week    PT Duration --   4-6 weeks   PT Treatment/Interventions ADLs/Self Care Home Management;Cryotherapy;Ultrasound;Moist Heat;Iontophoresis 4mg /ml Dexamethasone;Electrical Stimulation;Therapeutic exercise;Therapeutic activities;Functional mobility training;Neuromuscular re-education;Patient/family education;Manual techniques;Passive range of motion;Dry needling;Taping;Vasopneumatic Device    PT Next Visit Plan assess response to ionto, manual to rotator cuff as needed (TPDN), periscapular strength    PT Home Exercise Plan [id number]    Consulted and Agree with Plan of Care Patient             Patient will benefit from skilled therapeutic intervention in order to improve the following deficits and impairments:  Decreased endurance, Hypomobility, Decreased coordination, Decreased strength, Impaired flexibility, Impaired UE functional use, Postural dysfunction, Pain  Visit Diagnosis: Chronic right shoulder pain  Abnormal posture  Muscle weakness (generalized)     Problem List Patient Active Problem List   Diagnosis Date Noted   Acute appendicitis 09/15/2019   09/17/2019, PT, DPT, ATC 01/23/21 6:05 PM   [facility] [address] [place], [place], [place] Phone: [phone]   Fax:  [phone]  Name: Weslee Fogg MRN: Precious Gilding Date of Birth: Jan 04, 1986

## 2021-01-23 NOTE — Patient Instructions (Signed)

## 2021-02-02 ENCOUNTER — Encounter: Payer: Self-pay | Admitting: Physical Therapy

## 2021-02-02 ENCOUNTER — Other Ambulatory Visit: Payer: Self-pay

## 2021-02-02 ENCOUNTER — Ambulatory Visit: Payer: 59 | Attending: Family Medicine | Admitting: Physical Therapy

## 2021-02-02 DIAGNOSIS — M25511 Pain in right shoulder: Secondary | ICD-10-CM | POA: Insufficient documentation

## 2021-02-02 DIAGNOSIS — M6281 Muscle weakness (generalized): Secondary | ICD-10-CM | POA: Diagnosis present

## 2021-02-02 DIAGNOSIS — R293 Abnormal posture: Secondary | ICD-10-CM | POA: Insufficient documentation

## 2021-02-02 DIAGNOSIS — G8929 Other chronic pain: Secondary | ICD-10-CM | POA: Insufficient documentation

## 2021-02-02 NOTE — Therapy (Signed)
[facility] Outpatient Rehabilitation [facility] [address] [place], [place], [address] Phone: [phone]   Fax:  [phone]  Physical Therapy Treatment  Patient Details  Name: Grant Pugh MRN: [id number] Date of Birth: 03-17-1986 Referring Provider (PT): Dr. Cheri Rous, MD   Encounter Date: 02/02/2021   PT End of Session - 02/02/21 0856     Visit Number 6    Number of Visits 17    Date for PT Re-Evaluation 03/10/21    Authorization Type UHC    PT Start Time [phone]   pt arrived late   PT Stop Time 0930    PT Time Calculation (min) 34 min    Activity Tolerance Patient tolerated treatment well    Behavior During Therapy [facility] for tasks assessed/performed             History reviewed. No pertinent past medical history.  Past Surgical History:  Procedure Laterality Date   HERNIA REPAIR     LAPAROSCOPIC APPENDECTOMY N/A 09/15/2019   Procedure: LAPAROSCOPIC APPENDECTOMY;  Surgeon: Diamantina Monks, MD;  Location: [facility] OR;  Service: General;  Laterality: N/A;    There were no vitals filed for this visit.   Subjective Assessment - 02/02/21 0856     Subjective "The last session was kind of a bad day. Today is alot better."    Currently in Pain? Yes    Pain Score 5     Pain Location Shoulder    Pain Orientation Right    Pain Descriptors / Indicators Aching;Sore    Aggravating Factors  movement    Pain Relieving Factors rest, ionto                [facility] PT Assessment - 02/02/21 0001       Assessment   Medical Diagnosis R shoulder pain    Referring Provider (PT) Dr. Cheri Rous, MD                    [facility] Adult PT Treatment/Exercise:  Therapeutic Exercise: UBE L5 x - FWD/BWD x 2 min ea Upper trap / levator scapulae stretch 1 x 30 R only Seated lower trap strengthening with elbows propped on bolster 2 x 15 with GTB Seated scapular protraction bil with theraband 1 x 20    Manual Therapy: Skilled palpation and  monitoring of pt during TPDN IASTM along the R upper trap/ levator scapulae T1 - T7 PA grade IV  Neuromuscular re-ed: N/A  Therapeutic Activity: N/A  Modalities: N/A  Self Care: N/A  Consider / progression for next session:          Trigger Point Dry Needling - 02/02/21 0001     Consent Given? Yes    Education Handout Provided Yes    Muscles Treated Head and Neck Levator scapulae;Upper trapezius    Upper Trapezius Response Twitch reponse elicited;Palpable increased muscle length   R   Levator Scapulae Response Twitch response elicited;Palpable increased muscle length   R                  PT Education - 02/02/21 0859     Education Details benefits of TPDN and effects of treatment.    Person(s) Educated Patient    Methods Explanation;Verbal cues;Handout    Comprehension Verbalized understanding;Verbal cues required                 PT Long Term Goals - 01/23/21 1622       PT LONG TERM GOAL #  1   Title Pt will be indep with advanced HEP in prep for discharge    Baseline initiated at eval    Time 6    Period Weeks    Status On-going      PT LONG TERM GOAL #2   Title Pt will be able to pick up small son using R UE with </=2/10 pain    Baseline picks him up rates pain as 7/10    Time 6    Period Weeks    Status On-going      PT LONG TERM GOAL #3   Title Pt will be able to perform bench press without R shoulder pain    Baseline unable    Time 6    Period Weeks    Status On-going      PT LONG TERM GOAL #4   Title Pt will demo improved R shoulder ER to 4+/5 without pain    Baseline 3+/5 with pain; 4/5 pain 9/27    Time 6    Period Weeks    Status On-going      PT LONG TERM GOAL #5   Title Pt will have improved foto to 77% function    Baseline 59% function; 62% 9/27    Time 6    Period Weeks    Status New                   Plan - 02/02/21 0930     Clinical Impression Statement Grant Pugh reports he is making progress but  coninues to have pain inthe R shoulder mostly with reaching to the side. edcuated and consent was given for TPDN for the R upper trap/ levator scapuale followed with IASMT techniques and thoracic mobs. focused remainder of the session on scapular stability and scapulohumeral rhythm. followed session he noted decreased pain with reaching over head.    PT Treatment/Interventions ADLs/Self Care Home Management;Cryotherapy;Ultrasound;Moist Heat;Iontophoresis 4mg /ml Dexamethasone;Electrical Stimulation;Therapeutic exercise;Therapeutic activities;Functional mobility training;Neuromuscular re-education;Patient/family education;Manual techniques;Passive range of motion;Dry needling;Taping;Vasopneumatic Device    PT Next Visit Plan assess response to ionto, manual to rotator cuff as needed  response to TPDN, periscapular strength    PT Home Exercise Plan [id number]    Consulted and Agree with Plan of Care Patient             Patient will benefit from skilled therapeutic intervention in order to improve the following deficits and impairments:  Decreased endurance, Hypomobility, Decreased coordination, Decreased strength, Impaired flexibility, Impaired UE functional use, Postural dysfunction, Pain  Visit Diagnosis: Chronic right shoulder pain  Abnormal posture  Muscle weakness (generalized)     Problem List Patient Active Problem List   Diagnosis Date Noted   Acute appendicitis 09/15/2019   09/17/2019 PT, DPT, LAT, ATC  02/02/21  9:34 AM      [facility] [address] [place], [place], [place] Phone: [phone]   Fax:  [phone]  Name: Grant Pugh MRN: Precious Gilding Date of Birth: [birth year]

## 2021-02-06 ENCOUNTER — Other Ambulatory Visit: Payer: Self-pay

## 2021-02-06 ENCOUNTER — Ambulatory Visit: Payer: 59

## 2021-02-06 DIAGNOSIS — R293 Abnormal posture: Secondary | ICD-10-CM

## 2021-02-06 DIAGNOSIS — G8929 Other chronic pain: Secondary | ICD-10-CM

## 2021-02-06 DIAGNOSIS — M6281 Muscle weakness (generalized): Secondary | ICD-10-CM

## 2021-02-06 DIAGNOSIS — M25511 Pain in right shoulder: Secondary | ICD-10-CM | POA: Diagnosis not present

## 2021-02-06 NOTE — Therapy (Signed)
[facility] [address] [place], [place], [address] Phone: [phone]   Fax:  [phone]  Physical Therapy Treatment  Patient Details  Name: Grant Pugh MRN: [id number] Date of Birth: [birth year] Referring Provider (PT): Dr. Cheri Rous, MD   Encounter Date: 02/06/2021   PT End of Session - 02/06/21 1821     Visit Number 7    Number of Visits 17    Date for PT Re-Evaluation 03/10/21    Authorization Type UHC    PT Start Time 1821    PT Stop Time 1901    PT Time Calculation (min) 40 min    Activity Tolerance Patient tolerated treatment well    Behavior During Therapy [facility] for tasks assessed/performed             History reviewed. No pertinent past medical history.  Past Surgical History:  Procedure Laterality Date   HERNIA REPAIR     LAPAROSCOPIC APPENDECTOMY N/A 09/15/2019   Procedure: LAPAROSCOPIC APPENDECTOMY;  Surgeon: Diamantina Monks, MD;  Location: [facility] OR;  Service: General;  Laterality: N/A;    There were no vitals filed for this visit.   Subjective Assessment - 02/06/21 1822     Subjective Patient reports the shoulder is doing ok currently without reports of pain. He feels the TPDN did help with his overall pain.    Currently in Pain? No/denies                [facility] PT Assessment - 02/06/21 0001       AROM   Right Shoulder ABduction --   WFL; painful arc pre-manual therapy                  [facility] Adult PT Treatment/Exercise:   Therapeutic Exercise: UBE L5 x - FWD/BWD x 2 min ea Prone Y partial range RUE 2 x 10  Serratus wall slides 2 x 10  Serratus punch 2 x 15 6 lbs Wall ball circles in flexion 2 x 10 CW/CCW Resisted rows at 55 lbs 2 x 10  Pec stretch on roller 60 seconds   Not performed today* Upper trap / levator scapulae stretch 1 x 30 R only Seated lower trap strengthening with elbows propped on bolster 2 x 15 with GTB Seated scapular protraction bil with  theraband 1 x 20      Manual Therapy:  Rt Scapulothoracic mobilization inferior, superior, and rotation  T1 - T7 PA grade IV Rt GHJ mobilization inferior and posterior grade III-IV    Neuromuscular re-ed: N/A   Therapeutic Activity: N/A   Modalities: N/A   Self Care: N/A                        PT Long Term Goals - 01/23/21 1622       PT LONG TERM GOAL #1   Title Pt will be indep with advanced HEP in prep for discharge    Baseline initiated at eval    Time 6    Period Weeks    Status On-going      PT LONG TERM GOAL #2   Title Pt will be able to pick up small son using R UE with </=2/10 pain    Baseline picks him up rates pain as 7/10    Time 6    Period Weeks    Status On-going      PT LONG TERM GOAL #3   Title Pt will be  able to perform bench press without R shoulder pain    Baseline unable    Time 6    Period Weeks    Status On-going      PT LONG TERM GOAL #4   Title Pt will demo improved R shoulder ER to 4+/5 without pain    Baseline 3+/5 with pain; 4/5 pain 9/27    Time 6    Period Weeks    Status On-going      PT LONG TERM GOAL #5   Title Pt will have improved foto to 77% function    Baseline 59% function; 62% 9/27    Time 6    Period Weeks    Status New                   Plan - 02/06/21 1839     Clinical Impression Statement Patient initially reports painful arc with Rt shoulder abduction AROM, though following manual therapy no reports of pain with active abduction. Able to progress periscapular strengthening focusing on lower trap and serratus anterior engagement which he tolerated well without reports of shoulder pain. Utilized resistance machines today with patient initially demonstrating excessive upper trap engagement with resisted rows that he is able to correct with cueing.    PT Treatment/Interventions ADLs/Self Care Home Management;Cryotherapy;Ultrasound;Moist Heat;Iontophoresis 4mg /ml Dexamethasone;Electrical  Stimulation;Therapeutic exercise;Therapeutic activities;Functional mobility training;Neuromuscular re-education;Patient/family education;Manual techniques;Passive range of motion;Dry needling;Taping;Vasopneumatic Device    PT Next Visit Plan assess response to ionto, manual to rotator cuff as needed  response to TPDN, periscapular strength    PT Home Exercise Plan [id number]    Consulted and Agree with Plan of Care Patient             Patient will benefit from skilled therapeutic intervention in order to improve the following deficits and impairments:  Decreased endurance, Hypomobility, Decreased coordination, Decreased strength, Impaired flexibility, Impaired UE functional use, Postural dysfunction, Pain  Visit Diagnosis: Chronic right shoulder pain  Abnormal posture  Muscle weakness (generalized)     Problem List Patient Active Problem List   Diagnosis Date Noted   Acute appendicitis 09/15/2019   09/17/2019, PT, DPT, ATC 02/06/21 7:02 PM   [facility] [address] [place], [place], [place] Phone: [phone]   Fax:  [phone]  Name: Grant Pugh MRN: Precious Gilding Date of Birth: 09-Mar-1986

## 2021-02-07 ENCOUNTER — Ambulatory Visit: Payer: 59

## 2021-02-12 ENCOUNTER — Ambulatory Visit: Payer: 59

## 2021-02-14 ENCOUNTER — Ambulatory Visit: Payer: 59

## 2021-02-14 ENCOUNTER — Other Ambulatory Visit: Payer: Self-pay

## 2021-02-14 DIAGNOSIS — M25511 Pain in right shoulder: Secondary | ICD-10-CM | POA: Diagnosis not present

## 2021-02-14 DIAGNOSIS — M6281 Muscle weakness (generalized): Secondary | ICD-10-CM

## 2021-02-14 DIAGNOSIS — R293 Abnormal posture: Secondary | ICD-10-CM

## 2021-02-14 DIAGNOSIS — G8929 Other chronic pain: Secondary | ICD-10-CM

## 2021-02-14 NOTE — Therapy (Signed)
[facility] [address] [place], [place], [address] Phone: [phone]   Fax:  [phone]  Physical Therapy Treatment  Patient Details  Name: Grant Pugh MRN: [id number] Date of Birth: 1985/05/27 Referring Provider (PT): Dr. Cheri Rous, MD   Encounter Date: 02/14/2021   PT End of Session - 02/14/21 1745     Visit Number 8    Number of Visits 17    Date for PT Re-Evaluation 03/10/21    Authorization Type UHC    PT Start Time 1745    PT Stop Time 1826    PT Time Calculation (min) 41 min    Activity Tolerance Patient tolerated treatment well    Behavior During Therapy [facility] for tasks assessed/performed             History reviewed. No pertinent past medical history.  Past Surgical History:  Procedure Laterality Date   HERNIA REPAIR     LAPAROSCOPIC APPENDECTOMY N/A 09/15/2019   Procedure: LAPAROSCOPIC APPENDECTOMY;  Surgeon: Diamantina Monks, MD;  Location: [facility] OR;  Service: General;  Laterality: N/A;    There were no vitals filed for this visit.   Subjective Assessment - 02/14/21 1747     Subjective Patient reports the shoulder is feeling pretty good. He did 6 push-ups last night and did not have pain.    Currently in Pain? No/denies                   [facility] Adult PT Treatment/Exercise:   Therapeutic Exercise: UBE L3 x - FWD/BWD x 2 min ea Bilateral resisted ER red band 2 x 10  Chest press 3 x 8 with 10 lbs, in between sets manual therapy performed  Resisted shoulder horizontal abduction 2 x 10 red band  Bodyblade elbow at 90 degrees 3 x 20 sec  Resisted rows at 55 lbs 2 x 15    Not performed today* Prone Y partial range RUE 2 x 10  Serratus wall slides 2 x 10  Serratus punch 2 x 15 6 lbs Wall ball circles in flexion 2 x 10 CW/CCW  Pec stretch on roller 60 seconds  Upper trap / levator scapulae stretch 1 x 30 R only Seated lower trap strengthening with elbows propped on bolster 2 x 15  with GTB Seated scapular protraction bil with theraband 1 x 20      Manual Therapy:   Rt Scapulothoracic mobilization inferior, superior, and rotation  STM/DTM posterior rotator cuff Latissimus dorsi Rt active release  Not performed today* T1 - T7 PA grade IV Rt GHJ mobilization inferior and posterior grade III-IV    Neuromuscular re-ed: N/A   Therapeutic Activity: N/A   Modalities: N/A   Self Care: Updated HEP                           PT Education - 02/14/21 1814     Education Details see self care    Person(s) Educated Patient    Methods Explanation;Demonstration;Verbal cues;Handout    Comprehension Verbalized understanding;Returned demonstration;Verbal cues required                 PT Long Term Goals - 01/23/21 1622       PT LONG TERM GOAL #1   Title Pt will be indep with advanced HEP in prep for discharge    Baseline initiated at eval    Time 6    Period Weeks  Status On-going      PT LONG TERM GOAL #2   Title Pt will be able to pick up small son using R UE with </=2/10 pain    Baseline picks him up rates pain as 7/10    Time 6    Period Weeks    Status On-going      PT LONG TERM GOAL #3   Title Pt will be able to perform bench press without R shoulder pain    Baseline unable    Time 6    Period Weeks    Status On-going      PT LONG TERM GOAL #4   Title Pt will demo improved R shoulder ER to 4+/5 without pain    Baseline 3+/5 with pain; 4/5 pain 9/27    Time 6    Period Weeks    Status On-going      PT LONG TERM GOAL #5   Title Pt will have improved foto to 77% function    Baseline 59% function; 62% 9/27    Time 6    Period Weeks    Status New                   Plan - 02/14/21 1810     Clinical Impression Statement Able to initiate resisted pushing activity with patient reporting initial pinching sensation about infraspinatus with chest press. Following manual therapy no reports of pain, though  reported feeling weakness towards end of last set of chest press. Overall he tolerated session well today reporting shoulder fatigue with majority of exercises.    PT Treatment/Interventions ADLs/Self Care Home Management;Cryotherapy;Ultrasound;Moist Heat;Iontophoresis 4mg /ml Dexamethasone;Electrical Stimulation;Therapeutic exercise;Therapeutic activities;Functional mobility training;Neuromuscular re-education;Patient/family education;Manual techniques;Passive range of motion;Dry needling;Taping;Vasopneumatic Device    PT Next Visit Plan assess response to ionto, manual to rotator cuff as needed  response to TPDN, periscapular strength    PT Home Exercise Plan [id number]    Consulted and Agree with Plan of Care Patient             Patient will benefit from skilled therapeutic intervention in order to improve the following deficits and impairments:  Decreased endurance, Hypomobility, Decreased coordination, Decreased strength, Impaired flexibility, Impaired UE functional use, Postural dysfunction, Pain  Visit Diagnosis: Chronic right shoulder pain  Abnormal posture  Muscle weakness (generalized)     Problem List Patient Active Problem List   Diagnosis Date Noted   Acute appendicitis 09/15/2019   09/17/2019, PT, DPT, ATC 02/14/21 6:29 PM  [facility] [address] [place], [place], [place] Phone: [phone]   Fax:  [phone]  Name: Grant Pugh MRN: Precious Gilding Date of Birth: [birth year]

## 2021-02-19 ENCOUNTER — Telehealth: Payer: Self-pay

## 2021-02-19 NOTE — Telephone Encounter (Signed)
Left patient voicemail regarding missed PT appointment. Reminded patient of next scheduled visit and reviewed attendance policy.

## 2021-02-21 ENCOUNTER — Encounter: Payer: Self-pay | Admitting: Physical Therapy

## 2021-02-21 ENCOUNTER — Ambulatory Visit: Payer: 59 | Admitting: Physical Therapy

## 2021-02-21 ENCOUNTER — Other Ambulatory Visit: Payer: Self-pay

## 2021-02-21 DIAGNOSIS — R293 Abnormal posture: Secondary | ICD-10-CM

## 2021-02-21 DIAGNOSIS — M25511 Pain in right shoulder: Secondary | ICD-10-CM | POA: Diagnosis not present

## 2021-02-21 DIAGNOSIS — G8929 Other chronic pain: Secondary | ICD-10-CM

## 2021-02-21 NOTE — Therapy (Addendum)
[place], [place], [address] Phone: [phone]   Fax:  [phone]  Physical Therapy Treatment/ discharge note  Patient Details  Name: Ismar Yabut MRN: [id number] Date of Birth: 07-Aug-1985 Referring Provider (PT): Dr. Cecille Amsterdam, MD   Encounter Date: 02/21/2021   PT End of Session - 02/21/21 1551     Visit Number 9    Number of Visits 17    Date for PT Re-Evaluation 03/10/21    Authorization Type UHC    PT Start Time 8099    PT Stop Time 1630    PT Time Calculation (min) 42 min    Activity Tolerance Patient tolerated treatment well    Behavior During Therapy [facility] for tasks assessed/performed             History reviewed. No pertinent past medical history.  Past Surgical History:  Procedure Laterality Date   HERNIA REPAIR     LAPAROSCOPIC APPENDECTOMY N/A 09/15/2019   Procedure: LAPAROSCOPIC APPENDECTOMY;  Surgeon: Jesusita Oka, MD;  Location: [place];  Service: General;  Laterality: N/A;    There were no vitals filed for this visit.   Subjective Assessment - 02/21/21 1552     Subjective "doing better since the DN, some soreness from potentially overusing it yesterday."    Pertinent History About 4 months ago, pt was bench pressing when his right arm began to crack and pop; he discontinued the exercise and attempted another but was unable to lift it.    Patient Stated Goals "to get back to the gym"    Pain Score 3     Pain Location Shoulder    Pain Orientation Right    Pain Descriptors / Indicators Aching;Sore    Pain Type Surgical pain    Pain Onset More than a month ago    Pain Frequency Intermittent    Aggravating Factors  the usual raising the arm out to side.    Pain Relieving Factors rest, ionto                        [facility] Adult PT Treatment/Exercise:  Therapeutic Exercise: UBE 1 min warm up L5 61mn FWD/ BWD sprinting first 20 sec of every min Standing  sustained  scapular protraction 1 x 15 rolling pink foam  Tall plank scapular protraction going to fatigue x 2 sets Supine sustained shoulder abduction with shoulder flexion 2 x 15 with GTB Lower trap wall y's with GTB 1 x 15 1 x going to fatigue scaptoin with RTB RUE only  Manual Therapy:  Skilled palpation and monitoring of pt during TPDN IASTM along the infraspinatus / upper trap/ levator scapulae  Neuromuscular re-ed: N/A  Therapeutic Activity: N/A  Modalities: N/A  Self Care: N/A  Consider / progression for next session:          Trigger Point Dry Needling - 02/21/21 0001     Consent Given? Yes    Education Handout Provided Previously provided    Muscles Treated Upper Quadrant Infraspinatus;Deltoid    Infraspinatus Response --   R   Deltoid Response Twitch response elicited;Palpable increased muscle length   R posterior deltoid                       PT Long Term Goals - 01/23/21 1622       PT LONG TERM GOAL #1   Title Pt will be indep with advanced HEP  in prep for discharge    Baseline initiated at eval    Time 6    Period Weeks    Status On-going      PT LONG TERM GOAL #2   Title Pt will be able to pick up small son using R UE with </=2/10 pain    Baseline picks him up rates pain as 7/10    Time 6    Period Weeks    Status On-going      PT LONG TERM GOAL #3   Title Pt will be able to perform bench press without R shoulder pain    Baseline unable    Time 6    Period Weeks    Status On-going      PT LONG TERM GOAL #4   Title Pt will demo improved R shoulder ER to 4+/5 without pain    Baseline 3+/5 with pain; 4/5 pain 9/27    Time 6    Period Weeks    Status On-going      PT LONG TERM GOAL #5   Title Pt will have improved foto to 77% function    Baseline 59% function; 62% 9/27    Time 6    Period Weeks    Status New                   Plan - 02/21/21 1621     Clinical Impression Statement pt arrives reporting  he is doing better but does note some soreness from over doing it. Continued TPDN focusing on the R infraspinatus, upper trap followed with IASTM techniques. continued working on scapulohumeral rhyhtm increased endurance by working exercises to fatigue. End of session he noted feeling some soreness but overall was feeling better with shoulder abduction/ flexion.    PT Treatment/Interventions ADLs/Self Care Home Management;Cryotherapy;Ultrasound;Moist Heat;Iontophoresis 59m/ml Dexamethasone;Electrical Stimulation;Therapeutic exercise;Therapeutic activities;Functional mobility training;Neuromuscular re-education;Patient/family education;Manual techniques;Passive range of motion;Dry needling;Taping;Vasopneumatic Device    PT Next Visit Plan assess response to ionto, manual to rotator cuff as needed  response to TPDN, periscapular strength    PT Home Exercise Plan [id number]    Consulted and Agree with Plan of Care Patient             Patient will benefit from skilled therapeutic intervention in order to improve the following deficits and impairments:  Decreased endurance, Hypomobility, Decreased coordination, Decreased strength, Impaired flexibility, Impaired UE functional use, Postural dysfunction, Pain  Visit Diagnosis: Chronic right shoulder pain  Abnormal posture     Problem List Patient Active Problem List   Diagnosis Date Noted   Acute appendicitis 09/15/2019   KStarr LakePT, DPT, LAT, ATC  02/21/21  4:32 PM      [facility] StreetGClovis N[place] [address]Phone: [phone]  Fax:  [phone] Name: SDailen McclishMRN: [id number] of Birth: 4Nov 01, 1987     PHYSICAL THERAPY DISCHARGE SUMMARY  Visits from Start of Care: 9  Current functional level related to goals / functional outcomes: See goals   Remaining deficits: Current status unknown   Education / Equipment: HEP   Patient agrees to  discharge. Patient goals were not met. Patient is being discharged due to not returning since the last visit.  Wavie Hashimi PT, DPT, LAT, ATC  04/09/21  10:39 AM

## 2021-02-26 ENCOUNTER — Telehealth: Payer: Self-pay

## 2021-02-26 ENCOUNTER — Ambulatory Visit: Payer: 59

## 2021-02-26 NOTE — Telephone Encounter (Signed)
Left voicemail regarding missed PT appointment. Reviewed attendance policy and informed patient that he can schedule one visit at a time due to attendance.

## 2022-04-04 ENCOUNTER — Other Ambulatory Visit: Payer: Self-pay

## 2022-04-04 ENCOUNTER — Emergency Department (HOSPITAL_COMMUNITY): Payer: 59

## 2022-04-04 ENCOUNTER — Encounter (HOSPITAL_COMMUNITY): Payer: Self-pay

## 2022-04-04 ENCOUNTER — Emergency Department (HOSPITAL_COMMUNITY)
Admission: EM | Admit: 2022-04-04 | Discharge: 2022-04-05 | Disposition: A | Discharge status: Home / Self Care | Payer: 59 | Attending: Emergency Medicine | Admitting: Emergency Medicine

## 2022-04-04 DIAGNOSIS — R0789 Other chest pain: Secondary | ICD-10-CM | POA: Diagnosis present

## 2022-04-04 LAB — CBC
HCT: 42.2 % (ref 39.0–52.0)
Hemoglobin: 14.7 g/dL (ref 13.0–17.0)
MCH: 28.7 pg (ref 26.0–34.0)
MCHC: 34.8 g/dL (ref 30.0–36.0)
MCV: 82.4 fL (ref 80.0–100.0)
Platelets: 184 10*3/uL (ref 150–400)
RBC: 5.12 MIL/uL (ref 4.22–5.81)
RDW: 12.8 % (ref 11.5–15.5)
WBC: 7.1 10*3/uL (ref 4.0–10.5)
nRBC: 0 % (ref 0.0–0.2)

## 2022-04-04 LAB — BASIC METABOLIC PANEL
Anion gap: 9 (ref 5–15)
BUN: 16 mg/dL (ref 6–20)
CO2: 23 mmol/L (ref 22–32)
Calcium: 9.6 mg/dL (ref 8.9–10.3)
Chloride: 110 mmol/L (ref 98–111)
Creatinine, Ser: 1.28 mg/dL — ABNORMAL HIGH (ref 0.61–1.24)
GFR, Estimated: >60 mL/min (ref >60)
Glucose, Bld: 99 mg/dL (ref 70–99)
Potassium: 3.7 mmol/L (ref 3.5–5.1)
Sodium: 142 mmol/L (ref 135–145)

## 2022-04-04 LAB — TROPONIN I (HIGH SENSITIVITY)
Troponin I (High Sensitivity): 4 ng/L (ref <18)
Troponin I (High Sensitivity): 4 ng/L (ref <18)

## 2022-04-04 NOTE — ED Provider Triage Note (Signed)
Emergency Medicine Provider Triage Evaluation Note  Grant Pugh , a 36 y.o. male  was evaluated in triage.  Pt complains of chest pain that started around 8 AM this morning.  He says a first he noticed a fluttering in his heart still some tingling fixations in both of his fingers.  Said this quickly went away but then he started noticing some mid chest pressure Grant Pugh getting a deep breath.  He says the symptoms of gotten mildly worse since they started.  He denies any nausea, vomiting, dizziness, numbness or weakness, abdominal pain, fevers, chills, cough.  Review of Systems  Positive:  Negative:   Physical Exam  BP (!) 146/83 (BP Location: Right Arm)   Pulse 66   Temp 98.4 F (36.9 C)   Resp 18   Ht 5\' 10"  (1.778 m)   Wt 99.8 kg   SpO2 99%   BMI 31.57 kg/m  Gen:   Awake, no distress   Resp:  Normal effort  MSK:   Moves extremities without difficulty  Other:  Lungs are clear bilaterally.  Heart regular rate and rhythm.  Pulses 2+ and equal bilaterally  Medical Decision Making  Medically screening exam initiated at 8:51 PM.  Appropriate orders placed.  Grant Pugh was informed that the remainder of the evaluation will be completed by another provider, this initial triage assessment does not replace that evaluation, and the importance of remaining in the ED until their evaluation is complete.     , Claudie Leach 04/04/22 2052

## 2022-04-04 NOTE — ED Triage Notes (Signed)
Pt to ED pov, ambulatory to triage, NAD noted. Pt states he was in a meeting this am, felt like his heart skipped a beat and then felt tingling all over. Pt states it went away and then after he started having chest pain and shortness of breath that has not gone away.  Pt denies nausea/vomiting.

## 2022-04-05 NOTE — Discharge Instructions (Signed)
Lab or imaging are reassuring.  If your symptoms continue would like to follow-up with your primary care provider.  Come back to the emergency department if you develop chest pain, shortness of breath, severe abdominal pain, uncontrolled nausea, vomiting, diarrhea.

## 2022-04-05 NOTE — ED Provider Notes (Signed)
[facility] EMERGENCY DEPARTMENT Provider Note   CSN: [id number] Arrival date & time: 04/04/22  2025     History  Chief Complaint  Patient presents with   Chest Pain    Grant Pugh is a 36 y.o. male.  HPI   Patient without significant medical history presents complaints of chest pain.  Patient states chest pain started yesterday, happened while he was at work during a meeting he felt pain in the middle of his chest, did not radiate, he states he felt slightly short of breath, states it went away on its own, he states few hours later he had some slight chest pain and felt short of breath, he states that this is since improved since being in the ED, he denies any pleuritic chest pain, hemoptysis, states he has had a slight cough for last 2 weeks which has been nonproductive, no fevers no chills, he has no cardiac history, denies tobacco use, he has no family history of cardiac abnormalities, no history of PEs or DVTs currently not on hormone therapy, no recent surgeries or long immobilization.    Home Medications Prior to Admission medications   Medication Sig Start Date End Date Taking? Authorizing Provider  ibuprofen (ADVIL) 800 MG tablet Take 1 tablet (800 mg total) by mouth every 8 (eight) hours as needed. Patient not taking: Reported on 12/16/2020 09/15/19   Diamantina Monks, MD  oxyCODONE (ROXICODONE) 5 MG immediate release tablet Take 1 tablet (5 mg total) by mouth every 4 (four) hours as needed. Alternate doses with tylenol/ibuprofen Patient not taking: Reported on 12/16/2020 09/15/19   Diamantina Monks, MD      Allergies    Banana    Review of Systems   Review of Systems  Constitutional:  Negative for chills and fever.  Respiratory:  Positive for cough and shortness of breath.   Cardiovascular:  Negative for chest pain.  Gastrointestinal:  Negative for abdominal pain.  Neurological:  Negative for headaches.    Physical Exam Updated Vital Signs BP  123/70 (BP Location: Right Arm)   Pulse 73   Temp 98.4 F (36.9 C)   Resp 15   Ht 5\' 10"  (1.778 m)   Wt 99.8 kg   SpO2 98%   BMI 31.57 kg/m  Physical Exam Vitals and nursing note reviewed.  Constitutional:      General: He is not in acute distress.    Appearance: He is not ill-appearing.  HENT:     Head: Normocephalic and atraumatic.     Nose: No congestion.  Eyes:     Conjunctiva/sclera: Conjunctivae normal.  Cardiovascular:     Rate and Rhythm: Normal rate and regular rhythm.     Pulses: Normal pulses.     Heart sounds: No murmur heard.    No friction rub. No gallop.  Pulmonary:     Effort: No respiratory distress.     Breath sounds: No wheezing, rhonchi or rales.     Comments: No evidence of respiratory distress nontachypneic nonhypoxic, lung sounds are clear bilaterally. Musculoskeletal:     Right lower leg: No edema.     Left lower leg: No edema.  Skin:    General: Skin is warm and dry.  Neurological:     Mental Status: He is alert.  Psychiatric:        Mood and Affect: Mood normal.     ED Results / Procedures / Treatments   Labs (all labs ordered are listed, but only abnormal  results are displayed) Labs Reviewed  BASIC METABOLIC PANEL - Abnormal; Notable for the following components:      Result Value   Creatinine, Ser 1.28 (*)    All other components within normal limits  CBC  TROPONIN I (HIGH SENSITIVITY)  TROPONIN I (HIGH SENSITIVITY)    EKG None  Radiology DG Chest Port 1 View  Result Date: 04/04/2022 CLINICAL DATA:  Chest pain EXAM: PORTABLE CHEST 1 VIEW COMPARISON:  Chest x-ray 01/04/2021 FINDINGS: The heart size and mediastinal contours are within normal limits. Both lungs are clear. The visualized skeletal structures are unremarkable. IMPRESSION: No active disease. Electronically Signed   By: Ronney Asters M.D.   On: 04/04/2022 21:10    Procedures Procedures    Medications Ordered in ED Medications - No data to display  ED Course/  Medical Decision Making/ A&P                           Medical Decision Making  This patient presents to the ED for concern of chest pain, this involves an extensive number of treatment options, and is a complaint that carries with it a high risk of complications and morbidity.  The differential diagnosis includes ACS, PE, pneumonia    Additional history obtained:  Additional history obtained from wife at bedside External records from outside source obtained and reviewed including recent ER notes   Co morbidities that complicate the patient evaluation  N/A  Social Determinants of Health:  N/A    Lab Tests:  I Ordered, and personally interpreted labs.  The pertinent results include: CBC is unremarkable, BMP shows creatinine of 1.28, negative delta troponin   Imaging Studies ordered:  I ordered imaging studies including chest x-ray I independently visualized and interpreted imaging which showed negative acute findings I agree with the radiologist interpretation   Cardiac Monitoring:  The patient was maintained on a cardiac monitor.  I personally viewed and interpreted the cardiac monitored which showed an underlying rhythm of: Without signs of ischemia   Medicines ordered and prescription drug management:  I ordered medication including N/A I have reviewed the patients home medicines and have made adjustments as needed  Critical Interventions:  N/a   Reevaluation:  Presents with chest pain, triage obtain lab work imaging which I personally reviewed is unremarkable, patient benign physical exam, agreement discharge at this time.  Consultations Obtained:  N/A    Test Considered:  N/A    Rule out I have low suspicion for ACS as history is atypical, patient has no cardiac history, EKG was sinus rhythm without signs of ischemia, patient had negative delta troponin.  Low suspicion for PE as patient denies pleuritic chest pain, shortness of breath, patient  denies leg pain, no pedal edema noted on exam, patient was PERC negative.  Low suspicion for AAA or aortic dissection as history is atypical, patient has low risk factors.  Low suspicion for systemic infection as patient is nontoxic-appearing, vital signs reassuring, no obvious source infection noted on exam.     Dispostion and problem list  After consideration of the diagnostic results and the patients response to treatment, I feel that the patent would benefit from discharge.  Atypical chest pain-unclear etiology, suspect multifactorial, possibly stress related, bronchoconstriction cold weather and congruent URI.            Final Clinical Impression(s) / ED Diagnoses Final diagnoses:  Atypical chest pain    Rx / DC Orders ED  Discharge Orders     None         Aron Baba 04/05/22 0128    Orpah Greek, MD 04/05/22 [id number]

## 2022-07-01 ENCOUNTER — Ambulatory Visit
Admission: EM | Admit: 2022-07-01 | Discharge: 2022-07-01 | Disposition: A | Discharge status: Home / Self Care | Payer: 59

## 2022-07-01 DIAGNOSIS — K5289 Other specified noninfective gastroenteritis and colitis: Secondary | ICD-10-CM | POA: Diagnosis not present

## 2022-07-01 DIAGNOSIS — R112 Nausea with vomiting, unspecified: Secondary | ICD-10-CM | POA: Diagnosis not present

## 2022-07-01 NOTE — ED Triage Notes (Signed)
Pt presents with c/o fatigue abdominal pain, and diarrhea that began last week.

## 2022-07-01 NOTE — ED Provider Notes (Signed)
[facility]    CSN: [id number] Arrival date & time: 07/01/22  1422      History   Chief Complaint Chief Complaint  Patient presents with   Fatigue   Constipation    HPI Grant Pugh is a 37 y.o. male.   37 year old male presents with food poisoning.  Patient indicates 3 days ago he ate chicken wrap.  He indicates that about 30 minutes later he started having stomach cramping, evolved into nausea, repeated vomiting multiple times Thursday night into Friday morning.  Patient indicates that he also was having diarrhea, loose bowels that were frequent, no blood or unusual colors.  Patient indicates that over the past 2 days he feels like that he is backed up with some stomach discomfort.  Patient indicates he feels like he cannot have a normal bowel movement.  He indicates he has been taken several doses of Ex-Lax to have a normal bowel movement and has been having mainly loose bowels and diarrhea.  He indicates he is having fatigue, lethargy, and weakness.  He is without fever, chills, and has not thrown up for the past 2 days.  He is tolerating fluids well.   Constipation Associated symptoms: diarrhea     History reviewed. No pertinent past medical history.  Patient Active Problem List   Diagnosis Date Noted   Acute appendicitis 09/15/2019    Past Surgical History:  Procedure Laterality Date   HERNIA REPAIR     LAPAROSCOPIC APPENDECTOMY N/A 09/15/2019   Procedure: LAPAROSCOPIC APPENDECTOMY;  Surgeon: Jesusita Oka, MD;  Location: [facility] OR;  Service: General;  Laterality: N/A;       Home Medications    Prior to Admission medications   Medication Sig Start Date End Date Taking? Authorizing Provider  ibuprofen (ADVIL) 800 MG tablet Take 1 tablet (800 mg total) by mouth every 8 (eight) hours as needed. Patient not taking: Reported on 12/16/2020 09/15/19   Jesusita Oka, MD  oxyCODONE (ROXICODONE) 5 MG immediate release tablet Take 1 tablet (5 mg total) by  mouth every 4 (four) hours as needed. Alternate doses with tylenol/ibuprofen Patient not taking: Reported on 12/16/2020 09/15/19   Jesusita Oka, MD    Family History History reviewed. No pertinent family history.  Social History Social History   Tobacco Use   Smoking status: Never   Smokeless tobacco: Never  Vaping Use   Vaping Use: Never used  Substance Use Topics   Alcohol use: Yes    Comment: occ   Drug use: No     Allergies   Banana   Review of Systems Review of Systems  Gastrointestinal:  Positive for constipation and diarrhea.     Physical Exam Triage Vital Signs ED Triage Vitals  Enc Vitals Group     BP 07/01/22 1515 111/72     Pulse Rate 07/01/22 1515 67     Resp 07/01/22 1515 12     Temp 07/01/22 1515 97.9 F (36.6 C)     Temp Source 07/01/22 1515 Oral     SpO2 07/01/22 1515 96 %     Weight --      Height --      Head Circumference --      Peak Flow --      Pain Score 07/01/22 1514 0     Pain Loc --      Pain Edu? --      Excl. in [place]? --    No data found.  Updated Vital  Signs BP 111/72 (BP Location: Left Arm)   Pulse 67   Temp 97.9 F (36.6 C) (Oral)   Resp 12   SpO2 96%   Visual Acuity Right Eye Distance:   Left Eye Distance:   Bilateral Distance:    Right Eye Near:   Left Eye Near:    Bilateral Near:     Physical Exam Constitutional:      Appearance: Normal appearance.  Cardiovascular:     Rate and Rhythm: Normal rate and regular rhythm.     Heart sounds: Normal heart sounds.  Pulmonary:     Effort: Pulmonary effort is normal.     Breath sounds: Normal breath sounds and air entry. No wheezing, rhonchi or rales.  Abdominal:     General: Abdomen is flat. Bowel sounds are normal.     Palpations: Abdomen is soft.     Tenderness: There is no abdominal tenderness. There is no guarding or rebound.  Neurological:     Mental Status: He is alert.      UC Treatments / Results  Labs (all labs ordered are listed, but only  abnormal results are displayed) Labs Reviewed - No data to display  EKG   Radiology No results found.  Procedures Procedures (including critical care time)  Medications Ordered in UC Medications - No data to display  Initial Impression / Assessment and Plan / UC Course  I have reviewed the triage vital signs and the nursing notes.  Pertinent labs & imaging results that were available during my care of the patient were reviewed by me and considered in my medical decision making (see chart for details).    Plan: This will be treated with the following: 1.  Nausea and vomiting: A.  Patient advised to use Citrucel with copious amounts of fluid. 2.  Gastroenteritis: A.  Patient advised to use Citrucel with copious amounts of fluid to help regulate the bowel movements. 3.  Patient advised follow-up PCP or return to urgent care as needed. Final Clinical Impressions(s) / UC Diagnoses   Final diagnoses:  Nausea and vomiting, unspecified vomiting type  Other noninfectious gastroenteritis     Discharge Instructions      Advised to take Citrucel fiber supplement to help encourage BMs.  Make sure to drink copious amounts of fluids along with this medication.  Advised take multivitamin, or vitamin B12 to help increase energy.  Advised follow-up PCP or return to urgent care if symptoms fail to improve.    ED Prescriptions   None    PDMP not reviewed this encounter.   Nyoka Lint, PA-C 07/01/22 1555

## 2022-07-01 NOTE — Discharge Instructions (Addendum)
Advised to take Citrucel fiber supplement to help encourage BMs.  Make sure to drink copious amounts of fluids along with this medication.  Advised take multivitamin, or vitamin B12 to help increase energy.  Advised follow-up PCP or return to urgent care if symptoms fail to improve.

## 2023-02-27 ENCOUNTER — Other Ambulatory Visit: Payer: Self-pay

## 2023-02-27 ENCOUNTER — Other Ambulatory Visit: Payer: Self-pay | Admitting: Physician Assistant

## 2023-02-27 ENCOUNTER — Emergency Department (HOSPITAL_COMMUNITY)
Admission: EM | Admit: 2023-02-27 | Discharge: 2023-02-27 | Disposition: A | Discharge status: Home / Self Care | Payer: 59 | Attending: Emergency Medicine | Admitting: Emergency Medicine

## 2023-02-27 ENCOUNTER — Ambulatory Visit
Admission: RE | Admit: 2023-02-27 | Discharge: 2023-02-27 | Disposition: A | Discharge status: Home / Self Care | Payer: 59 | Source: Ambulatory Visit | Attending: Physician Assistant | Admitting: Physician Assistant

## 2023-02-27 DIAGNOSIS — M79644 Pain in right finger(s): Secondary | ICD-10-CM | POA: Diagnosis present

## 2023-02-27 NOTE — ED Provider Notes (Signed)
[place] EMERGENCY DEPARTMENT AT [facility] Provider Note   CSN: [id number] Arrival date & time: 02/27/23  1933     History  Chief Complaint  Patient presents with   Finger Injury    Grant Pugh is a 37 y.o. male who presents with right thumb pain after his son pulled his thumb earlier today.  Reports pain at the base of his thumb.  Grant Pugh is able to move the thumb fully but with some pain.  Denies any numbness or tingling in the finger.  Reports Grant Pugh took 1 naproxen prior to arriving in the ER.  HPI     Home Medications Prior to Admission medications   Medication Sig Start Date End Date Taking? Authorizing Provider  ibuprofen (ADVIL) 800 MG tablet Take 1 tablet (800 mg total) by mouth every 8 (eight) hours as needed. Patient not taking: Reported on 12/16/2020 09/15/19   Diamantina Monks, MD  oxyCODONE (ROXICODONE) 5 MG immediate release tablet Take 1 tablet (5 mg total) by mouth every 4 (four) hours as needed. Alternate doses with tylenol/ibuprofen Patient not taking: Reported on 12/16/2020 09/15/19   Diamantina Monks, MD      Allergies    Banana    Review of Systems   Review of Systems  Musculoskeletal:        Right thumb pain    Physical Exam Updated Vital Signs BP (!) 160/100 (BP Location: Left Arm) Comment: patient is in pain  Pulse 71   Temp 98.2 F (36.8 C) (Oral)   Resp 17   Ht 5\' 10"  (1.778 m)   Wt 109.8 kg   SpO2 97%   BMI 34.72 kg/m  Physical Exam Vitals and nursing note reviewed.  Constitutional:      Appearance: Normal appearance.  HENT:     Head: Atraumatic.  Pulmonary:     Effort: Pulmonary effort is normal.  Musculoskeletal:     Comments: No obvious deformity to the right hand.  There is mild edema around the distal aspect of the first metacarpal.  No erythema.  Tender palpation of the distal aspect of the right first metacarpal and proximal aspect of the first phalanx.  Able to fully flex and extend at the right hand 1st  through 5th digit MCPs, PIP, DIPs  Intact sensation in the right hand diffusely  Cap refill of the right first digit under 2 seconds  Neurological:     General: No focal deficit present.     Mental Status: Grant Pugh is alert.  Psychiatric:        Mood and Affect: Mood normal.        Behavior: Behavior normal.     ED Results / Procedures / Treatments   Labs (all labs ordered are listed, but only abnormal results are displayed) Labs Reviewed - No data to display  EKG None  Radiology DG Hand Complete Right  Result Date: 02/27/2023 CLINICAL DATA:  Right thumb pain. EXAM: RIGHT HAND - COMPLETE 3+ VIEW COMPARISON:  None Available. FINDINGS: There is no evidence of fracture or dislocation. There is no evidence of arthropathy or other focal bone abnormality. Soft tissues are unremarkable. IMPRESSION: Negative. Electronically Signed   By: Elgie Collard M.D.   On: 02/27/2023 21:03    Procedures Procedures    Medications Ordered in ED Medications - No data to display  ED Course/ Medical Decision Making/ A&P  Medical Decision Making  Differential diagnosis includes but is not limited to fracture, dislocation, tendon injury, sprain  ED Course:  Patient's right thumb no obvious deformity, slight erythema around the distal aspect of the first metacarpal.  Grant Pugh is tender to palpation in this area.  X-ray of the hand revealed no fractures or dislocation.  Suspect soft tissue injury.  Grant Pugh has full range of motion of the thumb, no concern for tendon injury at this time.  Neurovascular intact.     Impression: Right thumb pain  Disposition:  The patient was discharged home with instructions to follow-up with orthopedics if symptoms do not start improving in the next week.  May continue to take Tylenol and ibuprofen as needed for pain Return precautions given.  Imaging Studies ordered: I ordered imaging studies including x-ray right hand I independently  visualized the imaging with scope of interpretation limited to determining acute life threatening conditions related to emergency care. Imaging showed no fractures or dislocations and I agree with the radiologist interpretation            Final Clinical Impression(s) / ED Diagnoses Final diagnoses:  Thumb pain, right    Rx / DC Orders ED Discharge Orders     None         Arabella Merles, Cordelia Poche 02/27/23 2202    Bethann Berkshire, MD 03/03/23 1230

## 2023-02-27 NOTE — ED Triage Notes (Signed)
Pt POV from home d/t right thumb injury.  Pt has had xray - in [facility] - with OP imaging.  He just stated it is more painful now and came to get reduced if dislocated.   Pt has not received results yet.

## 2023-02-27 NOTE — Discharge Instructions (Addendum)
Your x-ray today is negative for any fracture or dislocation.  You may take up to 1000mg  of tylenol every 6 hours as needed for pain.  Do not take more then 4g per day.  You may use up to 800mg  ibuprofen every 8 hours as needed for pain.  Do not exceed 2.4g of ibuprofen per day.  You may gradually return to activity as pain allows.  If your thumb pain has not started to improve within the next week, please follow-up with the orthopedic office listed below.  Return to the ER if you have any numbness of your thumb, you are unable to move your thumb, any other new or concerning symptoms.

## 2023-11-27 ENCOUNTER — Emergency Department (HOSPITAL_BASED_OUTPATIENT_CLINIC_OR_DEPARTMENT_OTHER)

## 2023-11-27 ENCOUNTER — Emergency Department (HOSPITAL_BASED_OUTPATIENT_CLINIC_OR_DEPARTMENT_OTHER)
Admission: EM | Admit: 2023-11-27 | Discharge: 2023-11-27 | Disposition: A | Discharge status: Home / Self Care | Payer: Worker's Compensation | Attending: Emergency Medicine | Admitting: Emergency Medicine

## 2023-11-27 ENCOUNTER — Encounter (HOSPITAL_BASED_OUTPATIENT_CLINIC_OR_DEPARTMENT_OTHER): Payer: Self-pay

## 2023-11-27 ENCOUNTER — Other Ambulatory Visit: Payer: Self-pay

## 2023-11-27 DIAGNOSIS — S0990XA Unspecified injury of head, initial encounter: Secondary | ICD-10-CM | POA: Insufficient documentation

## 2023-11-27 DIAGNOSIS — S0011XA Contusion of right eyelid and periocular area, initial encounter: Secondary | ICD-10-CM | POA: Diagnosis not present

## 2023-11-27 DIAGNOSIS — W228XXA Striking against or struck by other objects, initial encounter: Secondary | ICD-10-CM | POA: Insufficient documentation

## 2023-11-27 DIAGNOSIS — S0591XA Unspecified injury of right eye and orbit, initial encounter: Secondary | ICD-10-CM | POA: Diagnosis present

## 2023-11-27 DIAGNOSIS — Y990 Civilian activity done for income or pay: Secondary | ICD-10-CM | POA: Diagnosis not present

## 2023-11-27 NOTE — Discharge Instructions (Signed)
 As we discussed, your workup in the ER today was reassuring for acute findings.  CT imaging of your head did not reveal any emergent concerns.  However, you could still have a concussion.  The management of this is supportive, please be sure to get plenty of rest, stay well-hydrated, take Tylenol /ibuprofen  as needed for pain.  Please use acetaminophen  (Tylenol ) or ibuprofen  (Advil , Motrin ) for pain.  You may use 800 mg ibuprofen  every 6 hours or 1000 mg of acetaminophen  every 6 hours.  You may choose to alternate between the two, this would be most effective. Do not exceed 4000 mg of acetaminophen  within 24 hours.  Do not exceed 3200 mg ibuprofen  within 24 hours.  In addition to this, please avoid bright lights, loud sounds, limit your screen time, and avoid contact sports until your symptoms have resolved.  Please call your PCP to schedule a close follow-up appointment.  Return if development of any new or worsening symptoms.

## 2023-11-27 NOTE — ED Triage Notes (Signed)
 Pt advises he hit head approx 2hrs ago; concern for concussion- off balance, slight HA.  Swelling above R eyebrow, no bleeding/ bruising noted at time of triage

## 2023-11-27 NOTE — ED Provider Notes (Signed)
 [name] EMERGENCY DEPARTMENT AT [facility] Provider Note   CSN: [id number] Arrival date & time: 11/27/23  1755     Patient presents with: No chief complaint on file.   Grant Pugh is a 38 y.o. male.   Patient with apparent past medical history presents today with complaints of head injury.  He reports that same occurred a few hours prior to arrival today when he hit his head on a steel beam at work.  He did not lose consciousness.  He is not anticoagulated.  He does note that he was dazed for a few minutes afterwards and did have some dizziness after, however this has since resolved.  Denies any changes to his vision, no nausea, no vomiting.  Does report that he has had a concussion previously and is concerned for same.  The history is provided by the patient. No language interpreter was used.       Prior to Admission medications   Medication Sig Start Date End Date Taking? Authorizing Provider  ibuprofen  (ADVIL ) 800 MG tablet Take 1 tablet (800 mg total) by mouth every 8 (eight) hours as needed. Patient not taking: Reported on 12/16/2020 09/15/19   Paola Dreama SAILOR, MD  oxyCODONE  (ROXICODONE ) 5 MG immediate release tablet Take 1 tablet (5 mg total) by mouth every 4 (four) hours as needed. Alternate doses with tylenol /ibuprofen  Patient not taking: Reported on 12/16/2020 09/15/19   Paola Dreama SAILOR, MD    Allergies: Banana    Review of Systems  Neurological:  Positive for headaches.  All other systems reviewed and are negative.   Updated Vital Signs BP 131/72   Pulse 76   Temp 98.2 F (36.8 C)   Resp 16   SpO2 96%   Physical Exam Vitals and nursing note reviewed.  Constitutional:      General: He is not in acute distress.    Appearance: Normal appearance. He is normal weight. He is not ill-appearing, toxic-appearing or diaphoretic.  HENT:     Head: Normocephalic and atraumatic.     Comments: Very small superficial bruise noted to the right eyebrow.  No  crepitus, significant tenderness, or deformity.  No overlying wound. Eyes:     Extraocular Movements: Extraocular movements intact.     Pupils: Pupils are equal, round, and reactive to light.  Cardiovascular:     Rate and Rhythm: Normal rate.  Pulmonary:     Effort: Pulmonary effort is normal. No respiratory distress.  Musculoskeletal:        General: Normal range of motion.     Cervical back: Normal range of motion and neck supple.     Comments: No tenderness to palpation of the cervical, thoracic, or lumbar spine.  No step-offs lesions deformity or overlying skin changes  Skin:    General: Skin is warm and dry.  Neurological:     General: No focal deficit present.     Mental Status: He is alert and oriented to person, place, and time.     Comments: Patient is alert and oriented and neurologically intact without focal deficits  Psychiatric:        Mood and Affect: Mood normal.        Behavior: Behavior normal.     (all labs ordered are listed, but only abnormal results are displayed) Labs Reviewed - No data to display  EKG: None  Radiology: CT Head Wo Contrast Result Date: 11/27/2023 CLINICAL DATA:  Dizziness with head injury and mild headaches, initial encounter EXAM:  CT HEAD WITHOUT CONTRAST TECHNIQUE: Contiguous axial images were obtained from the base of the skull through the vertex without intravenous contrast. RADIATION DOSE REDUCTION: This exam was performed according to the departmental dose-optimization program which includes automated exposure control, adjustment of the mA and/or kV according to patient size and/or use of iterative reconstruction technique. COMPARISON:  None Available. FINDINGS: Brain: No evidence of acute infarction, hemorrhage, hydrocephalus, extra-axial collection or mass lesion/mass effect. Vascular: No hyperdense vessel or unexpected calcification. Skull: Normal. Negative for fracture or focal lesion. Sinuses/Orbits: No acute finding. Other: None.  IMPRESSION: No acute intracranial abnormality noted. Electronically Signed   By: Oneil Devonshire M.D.   On: 11/27/2023 19:44     Procedures   Medications Ordered in the ED - No data to display                                  Medical Decision Making Amount and/or Complexity of Data Reviewed Radiology: ordered.   Patient presents today with complaints of head injury that occurred approximately 2 hours prior to arrival today.  He is afebrile, nontoxic-appearing, in no acute distress reassuring vital signs. Physical exam reveals very small superficial bruise noted to the right eyebrow.  No crepitus, significant tenderness, or deformity.  No overlying wound.  Patient is alert and oriented and neurologically intact without focal deficits.  No cervical spine tenderness.  CT imaging ordered and obtained which has resulted and reveals no acute findings.  I personally reviewed and interpreted this imaging and agree with radiology interpretation.  Discussed findings with patient, did discuss that he could have a concussion despite normal CT imaging.  Precautions discussed as well.  Recommend close PCP follow-up. Evaluation and diagnostic testing in the emergency department does not suggest an emergent condition requiring admission or immediate intervention beyond what has been performed at this time.  Plan for discharge with close PCP follow-up.  Patient is understanding and amenable with plan, educated on red flag symptoms that would prompt immediate return.  Patient discharged in stable condition.  Final diagnoses:  Injury of head, initial encounter    ED Discharge Orders     None     An After Visit Summary was printed and given to the patient.      Nora Lauraine DELENA DEVONNA 11/27/23 2055    Dreama Longs, MD 11/28/23 1349
